# Patient Record
Sex: Female | Born: 1945 | Race: Black or African American | Hispanic: No | Marital: Married | Smoking: Former smoker
Health system: Southern US, Community
[De-identification: ages and names within clinical notes are randomized; demographics above are authoritative.]

## PROBLEM LIST (undated history)

## (undated) DIAGNOSIS — J449 Chronic obstructive pulmonary disease, unspecified: Secondary | ICD-10-CM

## (undated) DIAGNOSIS — K219 Gastro-esophageal reflux disease without esophagitis: Secondary | ICD-10-CM

## (undated) DIAGNOSIS — J45909 Unspecified asthma, uncomplicated: Secondary | ICD-10-CM

## (undated) DIAGNOSIS — E7800 Pure hypercholesterolemia, unspecified: Secondary | ICD-10-CM

## (undated) DIAGNOSIS — M1990 Unspecified osteoarthritis, unspecified site: Secondary | ICD-10-CM

## (undated) HISTORY — PX: ABDOMINAL HYSTERECTOMY: SHX81

## (undated) HISTORY — PX: BREAST EXCISIONAL BIOPSY: SUR124

---

## 2005-04-13 ENCOUNTER — Ambulatory Visit: Payer: Self-pay | Admitting: Family Medicine

## 2006-08-02 ENCOUNTER — Ambulatory Visit: Payer: Self-pay | Admitting: Family Medicine

## 2007-11-04 ENCOUNTER — Ambulatory Visit: Payer: Self-pay | Admitting: Family Medicine

## 2008-04-17 ENCOUNTER — Ambulatory Visit: Payer: Self-pay | Admitting: Family Medicine

## 2008-11-16 ENCOUNTER — Ambulatory Visit: Payer: Self-pay | Admitting: Family Medicine

## 2009-12-06 ENCOUNTER — Ambulatory Visit: Payer: Self-pay | Admitting: Family Medicine

## 2011-02-21 ENCOUNTER — Ambulatory Visit: Payer: Self-pay | Admitting: Family Medicine

## 2011-11-20 ENCOUNTER — Ambulatory Visit: Payer: Self-pay

## 2011-11-20 LAB — RAPID INFLUENZA A&B ANTIGENS

## 2012-02-22 ENCOUNTER — Ambulatory Visit: Payer: Self-pay | Admitting: Family Medicine

## 2012-08-29 ENCOUNTER — Ambulatory Visit: Payer: Self-pay | Admitting: Family Medicine

## 2013-04-03 ENCOUNTER — Ambulatory Visit: Payer: Self-pay | Admitting: Family Medicine

## 2014-05-19 ENCOUNTER — Ambulatory Visit: Payer: Self-pay | Admitting: Family Medicine

## 2015-04-06 ENCOUNTER — Other Ambulatory Visit: Payer: Self-pay | Admitting: Family Medicine

## 2015-04-06 DIAGNOSIS — Z780 Asymptomatic menopausal state: Secondary | ICD-10-CM

## 2015-05-09 ENCOUNTER — Ambulatory Visit
Admission: EM | Admit: 2015-05-09 | Discharge: 2015-05-09 | Disposition: A | Discharge status: Home / Self Care | Payer: BLUE CROSS/BLUE SHIELD | Attending: Internal Medicine | Admitting: Internal Medicine

## 2015-05-09 DIAGNOSIS — M5410 Radiculopathy, site unspecified: Secondary | ICD-10-CM

## 2015-05-09 DIAGNOSIS — S39012A Strain of muscle, fascia and tendon of lower back, initial encounter: Secondary | ICD-10-CM | POA: Diagnosis not present

## 2015-05-09 HISTORY — DX: Pure hypercholesterolemia, unspecified: E78.00

## 2015-05-09 HISTORY — DX: Unspecified osteoarthritis, unspecified site: M19.90

## 2015-05-09 MED ORDER — KETOROLAC TROMETHAMINE 60 MG/2ML IM SOLN
60.0000 mg | Freq: Once | INTRAMUSCULAR | Status: AC
  Administered 2015-05-09: 60 mg via INTRAMUSCULAR

## 2015-05-09 MED ORDER — METAXALONE 800 MG PO TABS: 800.0000 mg | ORAL_TABLET | Freq: Three times a day (TID) | ORAL | Status: DC

## 2015-05-09 MED ORDER — NAPROXEN SODIUM 550 MG PO TABS: 550.0000 mg | ORAL_TABLET | Freq: Two times a day (BID) | ORAL | Status: DC

## 2015-05-09 NOTE — ED Notes (Signed)
Lower back pain for the past 1 month. Twinges at times makes it hard to even stand. Has had back spasm.

## 2015-05-09 NOTE — Discharge Instructions (Signed)
Lumbosacral Radiculopathy Lumbosacral radiculopathy is a pinched nerve or nerves in the low back (lumbosacral area). When this happens you may have weakness in your legs and may not be able to stand on your toes. You may have pain going down into your legs. There may be difficulties with walking normally. There are many causes of this problem. Sometimes this may happen from an injury, or simply from arthritis or boney problems. It may also be caused by other illnesses such as diabetes. If there is no improvement after treatment, further studies may be done to find the exact cause. DIAGNOSIS  X-rays may be needed if the problems become long standing. Electromyograms may be done. This study is one in which the working of nerves and muscles is studied. HOME CARE INSTRUCTIONS   Applications of ice packs may be helpful. Ice can be used in a plastic bag with a towel around it to prevent frostbite to skin. This may be used every 2 hours for 20 to 30 minutes, or as needed, while awake, or as directed by your caregiver.  Only take over-the-counter or prescription medicines for pain, discomfort, or fever as directed by your caregiver.  If physical therapy was prescribed, follow your caregiver's directions. SEEK IMMEDIATE MEDICAL CARE IF:   You have pain not controlled with medications.  You seem to be getting worse rather than better.  You develop increasing weakness in your legs.  You develop loss of bowel or bladder control.  You have difficulty with walking or balance, or develop clumsiness in the use of your legs.  You have a fever. MAKE SURE YOU:   Understand these instructions.  Will watch your condition.  Will get help right away if you are not doing well or get worse. Document Released: 10/02/2005 Document Revised: 12/25/2011 Document Reviewed: 05/22/2008 [facility] Patient Information 2015 [place], [place]. This information is not intended to replace advice given to you by your health  care provider. Make sure you discuss any questions you have with your health care provider. Cervical Radiculopathy Cervical radiculopathy happens when a nerve in the neck is pinched or bruised by a slipped (herniated) disk or by arthritic changes in the bones of the cervical spine. This can occur due to an injury or as part of the normal aging process. Pressure on the cervical nerves can cause pain or numbness that runs from your neck all the way down into your arm and fingers. CAUSES  There are many possible causes, including:  Injury.  Muscle tightness in the neck from overuse.  Swollen, painful joints (arthritis).  Breakdown or degeneration in the bones and joints of the spine (spondylosis) due to aging.  Bone spurs that may develop near the cervical nerves. SYMPTOMS  Symptoms include pain, weakness, or numbness in the affected arm and hand. Pain can be severe or irritating. Symptoms may be worse when extending or turning the neck. DIAGNOSIS  Your caregiver will ask about your symptoms and do a physical exam. He or she may test your strength and reflexes. X-rays, CT scans, and MRI scans may be needed in cases of injury or if the symptoms do not go away after a period of time. Electromyography (EMG) or nerve conduction testing may be done to study how your nerves and muscles are working. TREATMENT  Your caregiver may recommend certain exercises to help relieve your symptoms. Cervical radiculopathy can, and often does, get better with time and treatment. If your problems continue, treatment options may include:  Wearing a soft  collar for short periods of time.  Physical therapy to strengthen the neck muscles.  Medicines, such as nonsteroidal anti-inflammatory drugs (NSAIDs), oral corticosteroids, or spinal injections.  Surgery. Different types of surgery may be done depending on the cause of your problems. HOME CARE INSTRUCTIONS   Put ice on the affected area.  Put ice in a plastic  bag.  Place a towel between your skin and the bag.  Leave the ice on for 15-20 minutes, 03-04 times a day or as directed by your caregiver.  If ice does not help, you can try using heat. Take a warm shower or bath, or use a hot water bottle as directed by your caregiver.  You may try a gentle neck and shoulder massage.  Use a flat pillow when you sleep.  Only take over-the-counter or prescription medicines for pain, discomfort, or fever as directed by your caregiver.  If physical therapy was prescribed, follow your caregiver's directions.  If a soft collar was prescribed, use it as directed. SEEK IMMEDIATE MEDICAL CARE IF:   Your pain gets much worse and cannot be controlled with medicines.  You have weakness or numbness in your hand, arm, face, or leg.  You have a high fever or a stiff, rigid neck.  You lose bowel or bladder control (incontinence).  You have trouble with walking, balance, or speaking. MAKE SURE YOU:   Understand these instructions.  Will watch your condition.  Will get help right away if you are not doing well or get worse. Document Released: 06/27/2001 Document Revised: 12/25/2011 Document Reviewed: 05/16/2011 [facility] Patient Information 2015 [place], [place]. This information is not intended to replace advice given to you by your health care provider. Make sure you discuss any questions you have with your health care provider.  Low Back Strain with Rehab A strain is an injury in which a tendon or muscle is torn. The muscles and tendons of the lower back are vulnerable to strains. However, these muscles and tendons are very strong and require a great force to be injured. Strains are classified into three categories. Grade 1 strains cause pain, but the tendon is not lengthened. Grade 2 strains include a lengthened ligament, due to the ligament being stretched or partially ruptured. With grade 2 strains there is still function, although the function may be  decreased. Grade 3 strains involve a complete tear of the tendon or muscle, and function is usually impaired. SYMPTOMS   Pain in the lower back.  Pain that affects one side more than the other.  Pain that gets worse with movement and may be felt in the hip, buttocks, or back of the thigh.  Muscle spasms of the muscles in the back.  Swelling along the muscles of the back.  Loss of strength of the back muscles.  Crackling sound (crepitation) when the muscles are touched. CAUSES  Lower back strains occur when a force is placed on the muscles or tendons that is greater than they can handle. Common causes of injury include:  Prolonged overuse of the muscle-tendon units in the lower back, usually from incorrect posture.  A single violent injury or force applied to the back. RISK INCREASES WITH:  Sports that involve twisting forces on the spine or a lot of bending at the waist (football, rugby, weightlifting, bowling, golf, tennis, speed skating, racquetball, swimming, running, gymnastics, diving).  Poor strength and flexibility.  Failure to warm up properly before activity.  Family history of lower back pain or disk disorders.  Previous back injury or surgery (especially fusion).  Poor posture with lifting, especially heavy objects.  Prolonged sitting, especially with poor posture. PREVENTION   Learn and use proper posture when sitting or lifting (maintain proper posture when sitting, lift using the knees and legs, not at the waist).  Warm up and stretch properly before activity.  Allow for adequate recovery between workouts.  Maintain physical fitness:  Strength, flexibility, and endurance.  Cardiovascular fitness. PROGNOSIS  If treated properly, lower back strains usually heal within 6 weeks. RELATED COMPLICATIONS   Recurring symptoms, resulting in a chronic problem.  Chronic inflammation, scarring, and partial muscle-tendon tear.  Delayed healing or resolution  of symptoms.  Prolonged disability. TREATMENT  Treatment first involves the use of ice and medicine, to reduce pain and inflammation. The use of strengthening and stretching exercises may help reduce pain with activity. These exercises may be performed at home or with a therapist. Severe injuries may require referral to a therapist for further evaluation and treatment, such as ultrasound. Your caregiver may advise that you wear a back brace or corset, to help reduce pain and discomfort. Often, prolonged bed rest results in greater harm then benefit. Corticosteroid injections may be recommended. However, these should be reserved for the most serious cases. It is important to avoid using your back when lifting objects. At night, sleep on your back on a firm mattress with a pillow placed under your knees. If non-surgical treatment is unsuccessful, surgery may be needed.  MEDICATION   If pain medicine is needed, nonsteroidal anti-inflammatory medicines (aspirin and ibuprofen), or other minor pain relievers (acetaminophen), are often advised.  Do not take pain medicine for 7 days before surgery.  Prescription pain relievers may be given, if your caregiver thinks they are needed. Use only as directed and only as much as you need.  Ointments applied to the skin may be helpful.  Corticosteroid injections may be given by your caregiver. These injections should be reserved for the most serious cases, because they may only be given a certain number of times. HEAT AND COLD  Cold treatment (icing) should be applied for 10 to 15 minutes every 2 to 3 hours for inflammation and pain, and immediately after activity that aggravates your symptoms. Use ice packs or an ice massage.  Heat treatment may be used before performing stretching and strengthening activities prescribed by your caregiver, physical therapist, or athletic trainer. Use a heat pack or a warm water soak. SEEK MEDICAL CARE IF:   Symptoms get  worse or do not improve in 2 to 4 weeks, despite treatment.  You develop numbness, weakness, or loss of bowel or bladder function.  New, unexplained symptoms develop. (Drugs used in treatment may produce side effects.) EXERCISES  RANGE OF MOTION (ROM) AND STRETCHING EXERCISES - Low Back Strain Most people with lower back pain will find that their symptoms get worse with excessive bending forward (flexion) or arching at the lower back (extension). The exercises which will help resolve your symptoms will focus on the opposite motion.  Your physician, physical therapist or athletic trainer will help you determine which exercises will be most helpful to resolve your lower back pain. Do not complete any exercises without first consulting with your caregiver. Discontinue any exercises which make your symptoms worse until you speak to your caregiver.  If you have pain, numbness or tingling which travels down into your buttocks, leg or foot, the goal of the therapy is for these symptoms to move closer to  your back and eventually resolve. Sometimes, these leg symptoms will get better, but your lower back pain may worsen. This is typically an indication of progress in your rehabilitation. Be very alert to any changes in your symptoms and the activities in which you participated in the 24 hours prior to the change. Sharing this information with your caregiver will allow him/her to most efficiently treat your condition.  These exercises may help you when beginning to rehabilitate your injury. Your symptoms may resolve with or without further involvement from your physician, physical therapist or athletic trainer. While completing these exercises, remember:  Restoring tissue flexibility helps normal motion to return to the joints. This allows healthier, less painful movement and activity.  An effective stretch should be held for at least 30 seconds.  A stretch should never be painful. You should only feel a  gentle lengthening or release in the stretched tissue. FLEXION RANGE OF MOTION AND STRETCHING EXERCISES: STRETCH - Flexion, Single Knee to Chest   Lie on a firm bed or floor with both legs extended in front of you.  Keeping one leg in contact with the floor, bring your opposite knee to your chest. Hold your leg in place by either grabbing behind your thigh or at your knee.  Pull until you feel a gentle stretch in your lower back. Hold __________ seconds.  Slowly release your grasp and repeat the exercise with the opposite side. Repeat __________ times. Complete this exercise __________ times per day.  STRETCH - Flexion, Double Knee to Chest   Lie on a firm bed or floor with both legs extended in front of you.  Keeping one leg in contact with the floor, bring your opposite knee to your chest.  Tense your stomach muscles to support your back and then lift your other knee to your chest. Hold your legs in place by either grabbing behind your thighs or at your knees.  Pull both knees toward your chest until you feel a gentle stretch in your lower back. Hold __________ seconds.  Tense your stomach muscles and slowly return one leg at a time to the floor. Repeat __________ times. Complete this exercise __________ times per day.  STRETCH - Low Trunk Rotation  Lie on a firm bed or floor. Keeping your legs in front of you, bend your knees so they are both pointed toward the ceiling and your feet are flat on the floor.  Extend your arms out to the side. This will stabilize your upper body by keeping your shoulders in contact with the floor.  Gently and slowly drop both knees together to one side until you feel a gentle stretch in your lower back. Hold for __________ seconds.  Tense your stomach muscles to support your lower back as you bring your knees back to the starting position. Repeat the exercise to the other side. Repeat __________ times. Complete this exercise __________ times per day    EXTENSION RANGE OF MOTION AND FLEXIBILITY EXERCISES: STRETCH - Extension, Prone on Elbows   Lie on your stomach on the floor, a bed will be too soft. Place your palms about shoulder width apart and at the height of your head.  Place your elbows under your shoulders. If this is too painful, stack pillows under your chest.  Allow your body to relax so that your hips drop lower and make contact more completely with the floor.  Hold this position for __________ seconds.  Slowly return to lying flat on the floor. Repeat __________  times. Complete this exercise __________ times per day.  RANGE OF MOTION - Extension, Prone Press Ups  Lie on your stomach on the floor, a bed will be too soft. Place your palms about shoulder width apart and at the height of your head.  Keeping your back as relaxed as possible, slowly straighten your elbows while keeping your hips on the floor. You may adjust the placement of your hands to maximize your comfort. As you gain motion, your hands will come more underneath your shoulders.  Hold this position __________ seconds.  Slowly return to lying flat on the floor. Repeat __________ times. Complete this exercise __________ times per day.  RANGE OF MOTION- Quadruped, Neutral Spine   Assume a hands and knees position on a firm surface. Keep your hands under your shoulders and your knees under your hips. You may place padding under your knees for comfort.  Drop your head and point your tail bone toward the ground below you. This will round out your lower back like an angry cat. Hold this position for __________ seconds.  Slowly lift your head and release your tail bone so that your back sags into a large arch, like an old horse.  Hold this position for __________ seconds.  Repeat this until you feel limber in your lower back.  Now, find your "sweet spot." This will be the most comfortable position somewhere between the two previous positions. This is your  neutral spine. Once you have found this position, tense your stomach muscles to support your lower back.  Hold this position for __________ seconds. Repeat __________ times. Complete this exercise __________ times per day.  STRENGTHENING EXERCISES - Low Back Strain These exercises may help you when beginning to rehabilitate your injury. These exercises should be done near your "sweet spot." This is the neutral, low-back arch, somewhere between fully rounded and fully arched, that is your least painful position. When performed in this safe range of motion, these exercises can be used for people who have either a flexion or extension based injury. These exercises may resolve your symptoms with or without further involvement from your physician, physical therapist or athletic trainer. While completing these exercises, remember:   Muscles can gain both the endurance and the strength needed for everyday activities through controlled exercises.  Complete these exercises as instructed by your physician, physical therapist or athletic trainer. Increase the resistance and repetitions only as guided.  You may experience muscle soreness or fatigue, but the pain or discomfort you are trying to eliminate should never worsen during these exercises. If this pain does worsen, stop and make certain you are following the directions exactly. If the pain is still present after adjustments, discontinue the exercise until you can discuss the trouble with your caregiver. STRENGTHENING - Deep Abdominals, Pelvic Tilt  Lie on a firm bed or floor. Keeping your legs in front of you, bend your knees so they are both pointed toward the ceiling and your feet are flat on the floor.  Tense your lower abdominal muscles to press your lower back into the floor. This motion will rotate your pelvis so that your tail bone is scooping upwards rather than pointing at your feet or into the floor.  With a gentle tension and even breathing,  hold this position for __________ seconds. Repeat __________ times. Complete this exercise __________ times per day.  STRENGTHENING - Abdominals, Crunches   Lie on a firm bed or floor. Keeping your legs in front of you, bend  your knees so they are both pointed toward the ceiling and your feet are flat on the floor. Cross your arms over your chest.  Slightly tip your chin down without bending your neck.  Tense your abdominals and slowly lift your trunk high enough to just clear your shoulder blades. Lifting higher can put excessive stress on the lower back and does not further strengthen your abdominal muscles.  Control your return to the starting position. Repeat __________ times. Complete this exercise __________ times per day.  STRENGTHENING - Quadruped, Opposite UE/LE Lift   Assume a hands and knees position on a firm surface. Keep your hands under your shoulders and your knees under your hips. You may place padding under your knees for comfort.  Find your neutral spine and gently tense your abdominal muscles so that you can maintain this position. Your shoulders and hips should form a rectangle that is parallel with the floor and is not twisted.  Keeping your trunk steady, lift your right hand no higher than your shoulder and then your left leg no higher than your hip. Make sure you are not holding your breath. Hold this position __________ seconds.  Continuing to keep your abdominal muscles tense and your back steady, slowly return to your starting position. Repeat with the opposite arm and leg. Repeat __________ times. Complete this exercise __________ times per day.  STRENGTHENING - Lower Abdominals, Double Knee Lift  Lie on a firm bed or floor. Keeping your legs in front of you, bend your knees so they are both pointed toward the ceiling and your feet are flat on the floor.  Tense your abdominal muscles to brace your lower back and slowly lift both of your knees until they come over  your hips. Be certain not to hold your breath.  Hold __________ seconds. Using your abdominal muscles, return to the starting position in a slow and controlled manner. Repeat __________ times. Complete this exercise __________ times per day.  POSTURE AND BODY MECHANICS CONSIDERATIONS - Low Back Strain Keeping correct posture when sitting, standing or completing your activities will reduce the stress put on different body tissues, allowing injured tissues a chance to heal and limiting painful experiences. The following are general guidelines for improved posture. Your physician or physical therapist will provide you with any instructions specific to your needs. While reading these guidelines, remember:  The exercises prescribed by your provider will help you have the flexibility and strength to maintain correct postures.  The correct posture provides the best environment for your joints to work. All of your joints have less wear and tear when properly supported by a spine with good posture. This means you will experience a healthier, less painful body.  Correct posture must be practiced with all of your activities, especially prolonged sitting and standing. Correct posture is as important when doing repetitive low-stress activities (typing) as it is when doing a single heavy-load activity (lifting). RESTING POSITIONS Consider which positions are most painful for you when choosing a resting position. If you have pain with flexion-based activities (sitting, bending, stooping, squatting), choose a position that allows you to rest in a less flexed posture. You would want to avoid curling into a fetal position on your side. If your pain worsens with extension-based activities (prolonged standing, working overhead), avoid resting in an extended position such as sleeping on your stomach. Most people will find more comfort when they rest with their spine in a more neutral position, neither too rounded nor too  arched. Lying on a non-sagging bed on your side with a pillow between your knees, or on your back with a pillow under your knees will often provide some relief. Keep in mind, being in any one position for a prolonged period of time, no matter how correct your posture, can still lead to stiffness. PROPER SITTING POSTURE In order to minimize stress and discomfort on your spine, you must sit with correct posture. Sitting with good posture should be effortless for a healthy body. Returning to good posture is a gradual process. Many people can work toward this most comfortably by using various supports until they have the flexibility and strength to maintain this posture on their own. When sitting with proper posture, your ears will fall over your shoulders and your shoulders will fall over your hips. You should use the back of the chair to support your upper back. Your lower back will be in a neutral position, just slightly arched. You may place a small pillow or folded towel at the base of your lower back for support.  When working at a desk, create an environment that supports good, upright posture. Without extra support, muscles tire, which leads to excessive strain on joints and other tissues. Keep these recommendations in mind: CHAIR:  A chair should be able to slide under your desk when your back makes contact with the back of the chair. This allows you to work closely.  The chair's height should allow your eyes to be level with the upper part of your monitor and your hands to be slightly lower than your elbows. BODY POSITION  Your feet should make contact with the floor. If this is not possible, use a foot rest.  Keep your ears over your shoulders. This will reduce stress on your neck and lower back. INCORRECT SITTING POSTURES  If you are feeling tired and unable to assume a healthy sitting posture, do not slouch or slump. This puts excessive strain on your back tissues, causing more damage and  pain. Healthier options include:  Using more support, like a lumbar pillow.  Switching tasks to something that requires you to be upright or walking.  Talking a brief walk.  Lying down to rest in a neutral-spine position. PROLONGED STANDING WHILE SLIGHTLY LEANING FORWARD  When completing a task that requires you to lean forward while standing in one place for a long time, place either foot up on a stationary 2-4 inch high object to help maintain the best posture. When both feet are on the ground, the lower back tends to lose its slight inward curve. If this curve flattens (or becomes too large), then the back and your other joints will experience too much stress, tire more quickly, and can cause pain. CORRECT STANDING POSTURES Proper standing posture should be assumed with all daily activities, even if they only take a few moments, like when brushing your teeth. As in sitting, your ears should fall over your shoulders and your shoulders should fall over your hips. You should keep a slight tension in your abdominal muscles to brace your spine. Your tailbone should point down to the ground, not behind your body, resulting in an over-extended swayback posture.  INCORRECT STANDING POSTURES  Common incorrect standing postures include a forward head, locked knees and/or an excessive swayback. WALKING Walk with an upright posture. Your ears, shoulders and hips should all line-up. PROLONGED ACTIVITY IN A FLEXED POSITION When completing a task that requires you to bend forward at your waist or  lean over a low surface, try to find a way to stabilize 3 out of 4 of your limbs. You can place a hand or elbow on your thigh or rest a knee on the surface you are reaching across. This will provide you more stability so that your muscles do not fatigue as quickly. By keeping your knees relaxed, or slightly bent, you will also reduce stress across your lower back. CORRECT LIFTING TECHNIQUES DO :   Assume a wide  stance. This will provide you more stability and the opportunity to get as close as possible to the object which you are lifting.  Tense your abdominals to brace your spine. Bend at the knees and hips. Keeping your back locked in a neutral-spine position, lift using your leg muscles. Lift with your legs, keeping your back straight.  Test the weight of unknown objects before attempting to lift them.  Try to keep your elbows locked down at your sides in order get the best strength from your shoulders when carrying an object.  Always ask for help when lifting heavy or awkward objects. INCORRECT LIFTING TECHNIQUES DO NOT:   Lock your knees when lifting, even if it is a small object.  Bend and twist. Pivot at your feet or move your feet when needing to change directions.  Assume that you can safely pick up even a paper clip without proper posture. Document Released: 10/02/2005 Document Revised: 12/25/2011 Document Reviewed: 01/14/2009 [facility] Patient Information 2015 [place], [place]. This information is not intended to replace advice given to you by your health care provider. Make sure you discuss any questions you have with your health care provider.

## 2015-05-10 NOTE — ED Provider Notes (Signed)
CSN: [id number]     Arrival date & time 05/09/15  0919 History   First MD Initiated Contact with Patient 05/09/15 [id number]     Chief Complaint  Patient presents with  . Back Pain   (Consider location/radiation/quality/duration/timing/severity/associated sxs/prior Treatment) HPI Comments: Married african [place] female here for evaluation of new low back pain midline above waistline that is radiating to her right leg to knee and occasionally midback to left arm 8/10.  Started when she got out of shower to dry off legs today spasms with sitting/changing positions.  Slept ok last night.  Has been having intermittent back pain for the past month waxing and waning.  Has not taken a muscle relaxant for a couple years.  Last bad episode a couple years ago--typically stiff with sitting and if flaring spasms with changing positions  Took 2 OTC aleve yesterday without any relief.  Saw PCM last month and was given exercises to perform but she wasn't able to complete them due to pain especially knee to chest.  The only one able to complete was chest stretching.  Right hand dominant  Denied loss of bowel/bladder control, saddle paresthesias, arm/leg weakness, falls/droppping items.  The history is provided by the patient.    Past Medical History  Diagnosis Date  . High cholesterol   . Arthritis    Past Surgical History  Procedure Laterality Date  . Abdominal hysterectomy     History reviewed. No pertinent family history. History  Substance Use Topics  . Smoking status: Former Games developer  . Smokeless tobacco: Never Used  . Alcohol Use: 0.6 oz/week    1 Glasses of wine per week   OB History    No data available     Review of Systems  Constitutional: Positive for activity change. Negative for fever, chills, diaphoresis, appetite change and fatigue.  HENT: Negative for congestion, dental problem, ear pain, facial swelling, hearing loss, mouth sores, trouble swallowing and voice change.   Eyes: Negative  for photophobia, pain, discharge, redness, itching and visual disturbance.  Respiratory: Negative for cough, shortness of breath and wheezing.   Cardiovascular: Negative for chest pain and leg swelling.  Gastrointestinal: Negative for nausea, vomiting, abdominal pain, diarrhea, constipation, blood in stool and abdominal distention.  Endocrine: Negative for cold intolerance and heat intolerance.  Genitourinary: Negative for dysuria and enuresis.  Musculoskeletal: Positive for myalgias and back pain. Negative for joint swelling, arthralgias, gait problem, neck pain and neck stiffness.  Skin: Negative for color change, pallor, rash and wound.  Allergic/Immunologic: Positive for environmental allergies. Negative for food allergies.  Neurological: Negative for dizziness, tremors, seizures, syncope, facial asymmetry, speech difficulty, weakness, light-headedness, numbness and headaches.  Hematological: Negative for adenopathy. Does not bruise/bleed easily.  Psychiatric/Behavioral: Negative for behavioral problems, confusion, sleep disturbance and agitation.    Allergies  Codeine  Home Medications   Prior to Admission medications   Medication Sig Start Date End Date Taking? Authorizing Provider  albuterol (PROVENTIL HFA;VENTOLIN HFA) 108 (90 BASE) MCG/ACT inhaler Inhale into the lungs every 6 (six) hours as needed for wheezing or shortness of breath.   Yes Historical Provider, MD  fluticasone-salmeterol (ADVAIR HFA) 45-21 MCG/ACT inhaler Inhale 2 puffs into the lungs 2 (two) times daily.   Yes Historical Provider, MD  lovastatin (MEVACOR) 20 MG tablet Take 20 mg by mouth at bedtime.   Yes Historical Provider, MD  traZODone (DESYREL) 150 MG tablet Take by mouth at bedtime.   Yes Historical Provider, MD  metaxalone (SKELAXIN) 800 MG  tablet Take 1 tablet (800 mg total) by mouth 3 (three) times daily. 05/09/15   Barbaraann Barthel, NP  naproxen sodium (ANAPROX) 550 MG tablet Take 1 tablet (550 mg total)  by mouth 2 (two) times daily with a meal. 05/09/15   Jarold Song Betancourt, NP   BP 135/79 mmHg  Pulse 75  Temp(Src) 98.3 F (36.8 C) (Tympanic)  Resp 20  Ht  (1.651 m)  Wt 160 lb (72.576 kg)  BMI 26.63 kg/m2  SpO2 98% Physical Exam  Constitutional: She is oriented to person, place, and time. Vital signs are normal. She appears well-developed and well-nourished. No distress.  HENT:  Head: Normocephalic and atraumatic.  Right Ear: External ear normal.  Left Ear: External ear normal.  Nose: Nose normal.  Mouth/Throat: Oropharynx is clear and moist. No oropharyngeal exudate.  Eyes: Conjunctivae, EOM and lids are normal. Pupils are equal, round, and reactive to light. Right eye exhibits no discharge. Left eye exhibits no discharge. No scleral icterus.  Neck: Trachea normal and normal range of motion. Neck supple. No tracheal deviation present. No thyromegaly present.  Cardiovascular: Normal rate, regular rhythm, normal heart sounds and intact distal pulses.  Exam reveals no gallop and no friction rub.   No murmur heard. Pulmonary/Chest: Effort normal and breath sounds normal. No stridor. No respiratory distress. She has no wheezes. She has no rales. She exhibits no tenderness.  Abdominal: Soft. Bowel sounds are normal. She exhibits no distension and no mass. There is no tenderness. There is no rebound and no guarding.  Musculoskeletal: She exhibits tenderness. She exhibits no edema.       Right shoulder: Normal.       Left shoulder: Normal.       Right elbow: Normal.      Left elbow: Normal.       Cervical back: Normal.       Thoracic back: She exhibits tenderness, pain and spasm. She exhibits normal range of motion, no bony tenderness, no swelling, no edema, no deformity and no laceration.       Lumbar back: She exhibits decreased range of motion, tenderness, pain and spasm. She exhibits no bony tenderness, no swelling, no edema, no deformity, no laceration and normal pulse.       Right  hand: Normal.       Left hand: Normal.       Right upper leg: Normal. She exhibits no tenderness, no bony tenderness, no swelling, no edema, no deformity and no laceration.       Left upper leg: Normal. She exhibits no tenderness, no bony tenderness, no swelling, no edema, no deformity and no laceration.  Full arom c-spine; AROM with pain t-/l-spine able to only touch top of thighs for forward flexion approx 30 degrees patient reported sharp shooting pain right leg posterior to knee resolved with return to neutral position; normal heel toe slow in exam room; crossbody reach right worsens low back lumbar pain; right rotation worsens lumbar pain; left lateral bending worsens lumbar pain; negative bilateral straight leg raise; tight hamstrings; bilateral trapezius muscles tense; paraspinals T10-L1 taut/spams and TTP; strength 5/5 bilateral upper and lower extremities; DTRs 1+ symmetric bilaterally with distraction  Lymphadenopathy:    She has no cervical adenopathy.  Neurological: She is alert and oriented to person, place, and time. She is not disoriented. She displays no atrophy and no tremor. No cranial nerve deficit or sensory deficit. She exhibits normal muscle tone. She displays no seizure activity. Coordination and  gait normal. GCS eye subscore is 4. GCS verbal subscore is 5. GCS motor subscore is 6.  Reflex Scores:      Tricep reflexes are 1+ on the right side and 1+ on the left side.      Bicep reflexes are 1+ on the right side and 1+ on the left side.      Brachioradialis reflexes are 1+ on the right side and 1+ on the left side.      Patellar reflexes are 1+ on the right side and 1+ on the left side.      Achilles reflexes are 1+ on the right side and 1+ on the left side. Skin: Skin is warm, dry and intact. No rash noted. She is not diaphoretic. No erythema. No pallor.  Psychiatric: She has a normal mood and affect. Her speech is normal and behavior is normal. Judgment and thought content  normal. Cognition and memory are normal.  Nursing note and vitals reviewed.   ED Course  Procedures (including critical care time) Labs Review Labs Reviewed - No data to display  Imaging Review No results found. Medications  ketorolac (TORADOL) injection 60 mg (60 mg Intramuscular Given 05/09/15 1004)   Filed Vitals:   05/09/15 1052  BP: 135/79  Pulse: 75  Temp:   Resp: 20  1030 patient reported pain decreased to 4/10 after toradol injection feeling much better, radiation to leg resolved and ready to go home.  Discussed with patient I had ordered repeat vital signs prior to discharge and nurse would be in shortly to complete and give her discharge paperwork.  Patient ambulatory in exam room, moving from exam table to chair without difficulty or hesitancy now normal gait, face relaxed. Smiling.  Discussed may take skelaxin this afternoon if needed for worsening pain hold naproxen until bedtime tonight.  Ensure she hydrates all day.  Follow up if saddle paresthesias, leg/arm weakness or loss of bowel/bladder control with ER tonight.  Typically back pain resolves within a couple of weeks.  Avoid prolonged stationary/static positions during awake hours.  Gentle AROM/ADLSs. Spouse driving patient home.  Patient verbalized understanding of information/instructions, agreed with plan of care and had no further questions at this time.  MDM   1. Low back strain, initial encounter   2. Radiculopathy, unspecified spinal region    For acute pain, rest, and intermittent application of heat (do not sleep on heating pad) or ice.  I discussed longer-term treatment plan of PRN PO NSAIDSRx for naproxen 550mg  po BID and skelaxin 800mg  po TID and I discussed a home back care exercise program with a strengthening and flexibility exercise.  Patient given [employer] handout on low back strain with rehab exercises and sciatica/radiculopathy.  Proper avoidance of heavy lifting discussed.  Consider physical therapy or  chiropractic care and radiology if not improving.Avoid driving or drinking alcohol while taking muscle relaxants--cautioned may cause dizzyness and drowsiness.  Call or return to clinic as needed if these symptoms worsen or fail to improve as anticipated especially leg weakness, loss of bowel/bladder control or saddle paresthesias.   Patient verbalized understanding of instructions/information and agreed with plan of care.  P2:  Injury Prevention, fitness    Barbaraann Barthel, NP 05/10/15 2209

## 2015-06-15 ENCOUNTER — Other Ambulatory Visit: Payer: Self-pay | Admitting: Family Medicine

## 2015-06-15 DIAGNOSIS — Z1231 Encounter for screening mammogram for malignant neoplasm of breast: Secondary | ICD-10-CM

## 2015-06-22 ENCOUNTER — Ambulatory Visit
Admission: RE | Admit: 2015-06-22 | Discharge: 2015-06-22 | Disposition: A | Discharge status: Home / Self Care | Payer: BLUE CROSS/BLUE SHIELD | Source: Ambulatory Visit | Attending: Family Medicine | Admitting: Family Medicine

## 2015-06-22 DIAGNOSIS — M8580 Other specified disorders of bone density and structure, unspecified site: Secondary | ICD-10-CM | POA: Insufficient documentation

## 2015-06-22 DIAGNOSIS — Z1231 Encounter for screening mammogram for malignant neoplasm of breast: Secondary | ICD-10-CM | POA: Diagnosis not present

## 2015-06-22 DIAGNOSIS — Z780 Asymptomatic menopausal state: Secondary | ICD-10-CM | POA: Diagnosis present

## 2015-06-22 DIAGNOSIS — Z13820 Encounter for screening for osteoporosis: Secondary | ICD-10-CM | POA: Insufficient documentation

## 2016-07-21 ENCOUNTER — Other Ambulatory Visit: Payer: Self-pay | Admitting: Family Medicine

## 2016-07-21 DIAGNOSIS — Z1231 Encounter for screening mammogram for malignant neoplasm of breast: Secondary | ICD-10-CM

## 2016-07-26 ENCOUNTER — Ambulatory Visit: Admission: RE | Admit: 2016-07-26 | Payer: BLUE CROSS/BLUE SHIELD | Source: Ambulatory Visit

## 2016-08-08 ENCOUNTER — Other Ambulatory Visit: Payer: Self-pay | Admitting: Family Medicine

## 2016-08-08 ENCOUNTER — Ambulatory Visit
Admission: RE | Admit: 2016-08-08 | Discharge: 2016-08-08 | Disposition: A | Discharge status: Home / Self Care | Payer: Managed Care, Other (non HMO) | Source: Ambulatory Visit | Attending: Family Medicine | Admitting: Family Medicine

## 2016-08-08 DIAGNOSIS — Z1231 Encounter for screening mammogram for malignant neoplasm of breast: Secondary | ICD-10-CM | POA: Diagnosis not present

## 2016-10-16 HISTORY — PX: EYE SURGERY: SHX253

## 2016-11-14 ENCOUNTER — Encounter: Payer: Self-pay | Admitting: *Deleted

## 2016-11-14 ENCOUNTER — Ambulatory Visit
Admission: EM | Admit: 2016-11-14 | Discharge: 2016-11-14 | Disposition: A | Discharge status: Home / Self Care | Payer: Managed Care, Other (non HMO) | Attending: Family Medicine | Admitting: Family Medicine

## 2016-11-14 DIAGNOSIS — J0100 Acute maxillary sinusitis, unspecified: Secondary | ICD-10-CM | POA: Diagnosis not present

## 2016-11-14 DIAGNOSIS — J441 Chronic obstructive pulmonary disease with (acute) exacerbation: Secondary | ICD-10-CM | POA: Diagnosis not present

## 2016-11-14 MED ORDER — PREDNISONE 20 MG PO TABS: ORAL_TABLET | ORAL | 0 refills | Status: DC

## 2016-11-14 MED ORDER — BENZONATATE 100 MG PO CAPS: 100.0000 mg | ORAL_CAPSULE | Freq: Three times a day (TID) | ORAL | 0 refills | Status: DC | PRN

## 2016-11-14 MED ORDER — AMOXICILLIN-POT CLAVULANATE 875-125 MG PO TABS: 1.0000 | ORAL_TABLET | Freq: Two times a day (BID) | ORAL | 0 refills | Status: DC

## 2016-11-14 NOTE — ED Triage Notes (Signed)
Productive cough- clear, dyspnea, headache, x several days. Pt recently tx for pneumonia.

## 2016-11-14 NOTE — ED Provider Notes (Signed)
[facility]    CSN: [id number] Arrival date & time: 11/14/16  1014     History   Chief Complaint Chief Complaint  Patient presents with  . Cough  . Shortness of Breath  . Headache    HPI Rachael Mills is a 71 y.o. female.   The history is provided by the patient.  Cough  Associated symptoms: headaches, shortness of breath and wheezing   Shortness of Breath  Associated symptoms: cough, headaches and wheezing   Headache  Associated symptoms: congestion, cough, facial pain, fatigue and URI   URI  Presenting symptoms: congestion, cough, facial pain and fatigue   Onset quality:  Sudden Duration:  1 week Timing:  Constant Progression:  Worsening Chronicity:  New Relieved by:  Nothing Ineffective treatments:  OTC medications Associated symptoms: headaches, sinus pain and wheezing   Risk factors: being elderly, chronic respiratory disease (copd), recent illness and sick contacts   Risk factors: no chronic cardiac disease, no chronic kidney disease, no diabetes mellitus, no immunosuppression and no recent travel     Past Medical History:  Diagnosis Date  . Arthritis   . High cholesterol     There are no active problems to display for this patient.   Past Surgical History:  Procedure Laterality Date  . ABDOMINAL HYSTERECTOMY      OB History    No data available       Home Medications    Prior to Admission medications   Medication Sig Start Date End Date Taking? Authorizing Provider  albuterol (PROVENTIL HFA;VENTOLIN HFA) 108 (90 BASE) MCG/ACT inhaler Inhale into the lungs every 6 (six) hours as needed for wheezing or shortness of breath.   Yes Historical Provider, MD  fluticasone-salmeterol (ADVAIR HFA) 45-21 MCG/ACT inhaler Inhale 2 puffs into the lungs 2 (two) times daily.   Yes Historical Provider, MD  lovastatin (MEVACOR) 20 MG tablet Take 20 mg by mouth at bedtime.   Yes Historical Provider, MD  traZODone (DESYREL) 150 MG tablet  Take by mouth at bedtime.   Yes Historical Provider, MD  amoxicillin-clavulanate (AUGMENTIN) 875-125 MG tablet Take 1 tablet by mouth 2 (two) times daily. 11/14/16   Payton Mccallum, MD  benzonatate (TESSALON) 100 MG capsule Take 1 capsule (100 mg total) by mouth 3 (three) times daily as needed for cough. 11/14/16   Payton Mccallum, MD  metaxalone (SKELAXIN) 800 MG tablet Take 1 tablet (800 mg total) by mouth 3 (three) times daily. 05/09/15   Barbaraann Barthel, NP  naproxen sodium (ANAPROX) 550 MG tablet Take 1 tablet (550 mg total) by mouth 2 (two) times daily with a meal. 05/09/15   Barbaraann Barthel, NP  predniSONE (DELTASONE) 20 MG tablet 3 tabs po qd for 2 days, then 2 tabs po qd for 3 days, then 1 tab po qd for 3 days, then half a tab po qd for 2 days 11/14/16   Payton Mccallum, MD    Family History Family History  Problem Relation Age of Onset  . Breast cancer Neg Hx     Social History Social History  Substance Use Topics  . Smoking status: Former Games developer  . Smokeless tobacco: Never Used  . Alcohol use 0.6 oz/week    1 Glasses of wine per week     Allergies   Codeine   Review of Systems Review of Systems  Constitutional: Positive for fatigue.  HENT: Positive for congestion and sinus pain.   Respiratory: Positive for cough, shortness of breath  and wheezing.   Neurological: Positive for headaches.     Physical Exam Triage Vital Signs ED Triage Vitals  Enc Vitals Group     BP 11/14/16 1026 135/74     Pulse Rate 11/14/16 1026 (!) 105     Resp 11/14/16 1026 16     Temp 11/14/16 1026 99.3 F (37.4 C)     Temp Source 11/14/16 1026 Oral     SpO2 11/14/16 1026 97 %     Weight 11/14/16 1028 160 lb (72.6 kg)     Height 11/14/16 1028 5\' 5"  (1.651 m)     Head Circumference --      Peak Flow --      Pain Score --      Pain Loc --      Pain Edu? --      Excl. in GC? --    No data found.   Updated Vital Signs BP 135/74 (BP Location: Left Arm)   Pulse (!) 105   Temp 99.3 F  (37.4 C) (Oral)   Resp 16   Ht 5\' 5"  (1.651 m)   Wt 160 lb (72.6 kg)   SpO2 97%   BMI 26.63 kg/m   Visual Acuity Right Eye Distance:   Left Eye Distance:   Bilateral Distance:    Right Eye Near:   Left Eye Near:    Bilateral Near:     Physical Exam  Constitutional: She appears well-developed and well-nourished. No distress.  HENT:  Head: Normocephalic and atraumatic.  Right Ear: Tympanic membrane, external ear and ear canal normal.  Left Ear: Tympanic membrane, external ear and ear canal normal.  Nose: Mucosal edema and rhinorrhea present. No nose lacerations, sinus tenderness, nasal deformity, septal deviation or nasal septal hematoma. No epistaxis.  No foreign bodies. Right sinus exhibits maxillary sinus tenderness and frontal sinus tenderness. Left sinus exhibits maxillary sinus tenderness and frontal sinus tenderness.  Mouth/Throat: Uvula is midline, oropharynx is clear and moist and mucous membranes are normal. No oropharyngeal exudate.  Eyes: Conjunctivae and EOM are normal. Pupils are equal, round, and reactive to light. Right eye exhibits no discharge. Left eye exhibits no discharge. No scleral icterus.  Neck: Normal range of motion. Neck supple. No thyromegaly present.  Cardiovascular: Normal rate, regular rhythm and normal heart sounds.   Pulmonary/Chest: Effort normal. No respiratory distress. She has wheezes (diffuse; rhonchi). She has no rales.  Lymphadenopathy:    She has no cervical adenopathy.  Skin: She is not diaphoretic.  Nursing note and vitals reviewed.    UC Treatments / Results  Labs (all labs ordered are listed, but only abnormal results are displayed) Labs Reviewed - No data to display  EKG  EKG Interpretation None       Radiology No results found.  Procedures Procedures (including critical care time)  Medications Ordered in UC Medications - No data to display   Initial Impression / Assessment and Plan / UC Course  I have reviewed  the triage vital signs and the nursing notes.  Pertinent labs & imaging results that were available during my care of the patient were reviewed by me and considered in my medical decision making (see chart for details).       Final Clinical Impressions(s) / UC Diagnoses   Final diagnoses:  COPD exacerbation (HCC)  Acute maxillary sinusitis, recurrence not specified    New Prescriptions Discharge Medication List as of 11/14/2016 11:01 AM    START taking these medications   Details  amoxicillin-clavulanate (AUGMENTIN) 875-125 MG tablet Take 1 tablet by mouth 2 (two) times daily., Starting Tue 11/14/2016, Normal    benzonatate (TESSALON) 100 MG capsule Take 1 capsule (100 mg total) by mouth 3 (three) times daily as needed for cough., Starting Tue 11/14/2016, Normal    predniSONE (DELTASONE) 20 MG tablet 3 tabs po qd for 2 days, then 2 tabs po qd for 3 days, then 1 tab po qd for 3 days, then half a tab po qd for 2 days, Normal       1. diagnosis reviewed with patient 2. rx as per orders above; reviewed possible side effects, interactions, risks and benefits  3. Recommend supportive treatment with rest, fluids 4. Follow-up prn if symptoms worsen or don't improve   Payton Mccallumrlando Benen Weida, MD 11/14/16 1113

## 2016-12-24 ENCOUNTER — Ambulatory Visit
Admission: EM | Admit: 2016-12-24 | Discharge: 2016-12-24 | Disposition: A | Discharge status: Home / Self Care | Payer: Managed Care, Other (non HMO) | Attending: Family Medicine | Admitting: Family Medicine

## 2016-12-24 ENCOUNTER — Encounter: Payer: Self-pay | Admitting: Emergency Medicine

## 2016-12-24 DIAGNOSIS — J069 Acute upper respiratory infection, unspecified: Secondary | ICD-10-CM | POA: Diagnosis not present

## 2016-12-24 DIAGNOSIS — J029 Acute pharyngitis, unspecified: Secondary | ICD-10-CM

## 2016-12-24 DIAGNOSIS — J441 Chronic obstructive pulmonary disease with (acute) exacerbation: Secondary | ICD-10-CM

## 2016-12-24 LAB — RAPID STREP SCREEN (MED CTR MEBANE ONLY): Streptococcus, Group A Screen (Direct): NEGATIVE

## 2016-12-24 MED ORDER — PREDNISONE 20 MG PO TABS: 40.0000 mg | ORAL_TABLET | Freq: Every day | ORAL | 0 refills | Status: DC

## 2016-12-24 MED ORDER — DOXYCYCLINE HYCLATE 100 MG PO CAPS: 100.0000 mg | ORAL_CAPSULE | Freq: Two times a day (BID) | ORAL | 0 refills | Status: DC

## 2016-12-24 MED ORDER — BENZONATATE 100 MG PO CAPS: 100.0000 mg | ORAL_CAPSULE | Freq: Three times a day (TID) | ORAL | 0 refills | Status: DC | PRN

## 2016-12-24 NOTE — ED Provider Notes (Signed)
[facility] ____________________________________________  Time seen: Approximately 9:10 AM  I have reviewed the triage vital signs and the nursing notes.   HISTORY  Chief Complaint Cough  HPI Rachael Mills is a 71 y.o. female with past medical history of hypertension, hyperlipidemia, COPD presenting for 5 days of cough, congestion and sore throat. Reports some hoarse voice. States cough is a dry hacking nonproductive cough, occasional wheeze. Uses home albuterol inhalers as needed with some improvement. States clear nasal drainage, denies sinus pain. Reports did have some ear discomfort, denies currently. States mild sore throat currently. Reports has continued to eat and drink well. Reports has continued to remain active. Denies known sick contacts. Denies home sick contacts. Denies fevers. Denies chest pain or shortness of breath.  Patient reports that she has been on 2 courses of Augmentin recently treated reports getting in January she was treated with Augmentin for pneumonia, then second round in January for sinusitis. Patient reports she had been doing better after the sinusitis and pneumonia. Has been following with her primary physician.  Denies chest pain, pain with deep breath, shortness of breath, abdominal pain, dysuria, extremity pain, extremity swelling or rash. Denies cardiac history. Denies renal insufficiency.  Leim FabryALDRIDGE,BARBARA, MD: PCP  Past Medical History:  Diagnosis Date  . Arthritis   . High cholesterol     There are no active problems to display for this patient.   Past Surgical History:  Procedure Laterality Date  . ABDOMINAL HYSTERECTOMY       No current facility-administered medications for this encounter.   Current Outpatient Prescriptions:  .  albuterol (PROVENTIL HFA;VENTOLIN HFA) 108 (90 BASE) MCG/ACT inhaler, Inhale into the lungs every 6 (six) hours as needed for wheezing or shortness of breath., Disp: , Rfl:  .   benzonatate (TESSALON PERLES) 100 MG capsule, Take 1 capsule (100 mg total) by mouth 3 (three) times daily as needed for cough., Disp: 15 capsule, Rfl: 0 .  doxycycline (VIBRAMYCIN) 100 MG capsule, Take 1 capsule (100 mg total) by mouth 2 (two) times daily., Disp: 20 capsule, Rfl: 0 .  fluticasone-salmeterol (ADVAIR HFA) 45-21 MCG/ACT inhaler, Inhale 2 puffs into the lungs 2 (two) times daily., Disp: , Rfl:  .  lovastatin (MEVACOR) 20 MG tablet, Take 20 mg by mouth at bedtime., Disp: , Rfl:  .  metaxalone (SKELAXIN) 800 MG tablet, Take 1 tablet (800 mg total) by mouth 3 (three) times daily., Disp: 21 tablet, Rfl: 0 .  naproxen sodium (ANAPROX) 550 MG tablet, Take 1 tablet (550 mg total) by mouth 2 (two) times daily with a meal., Disp: 30 tablet, Rfl: 0 .  predniSONE (DELTASONE) 20 MG tablet, Take 2 tablets (40 mg total) by mouth daily., Disp: 10 tablet, Rfl: 0 .  traZODone (DESYREL) 150 MG tablet, Take by mouth at bedtime., Disp: , Rfl:   Allergies Codeine  Family History  Problem Relation Age of Onset  . Breast cancer Neg Hx     Social History Social History  Substance Use Topics  . Smoking status: Former Games developermoker  . Smokeless tobacco: Never Used  . Alcohol use 0.6 oz/week    1 Glasses of wine per week    Review of Systems Constitutional: No fever/chills Eyes: No visual changes. ENT: Positive sore throat. Cardiovascular: Denies chest pain. Respiratory: Denies shortness of breath. Gastrointestinal: No abdominal pain.  No nausea, no vomiting.  No diarrhea.  No constipation. Genitourinary: Negative for dysuria. Musculoskeletal: Negative for back pain. Skin: Negative for rash. Neurological: Negative  for headaches, focal weakness or numbness.  10-point ROS otherwise negative.  ____________________________________________   PHYSICAL EXAM:  VITAL SIGNS: ED Triage Vitals  Enc Vitals Group     BP 12/24/16 0852 124/73     Pulse Rate 12/24/16 0852 89     Resp 12/24/16 0852 16      Temp 12/24/16 0852 98.5 F (36.9 C)     Temp Source 12/24/16 0852 Oral     SpO2 12/24/16 0852 96 %     Weight 12/24/16 0851 155 lb (70.3 kg)     Height 12/24/16 0851 5\' 5"  (1.651 m)     Head Circumference --      Peak Flow --      Pain Score 12/24/16 0852 0     Pain Loc --      Pain Edu? --      Excl. in GC? --     Constitutional: Alert and oriented. Well appearing and in no acute distress. Eyes: Conjunctivae are normal. PERRL. EOMI. Head: Atraumatic. No sinus tenderness to palpation. No swelling. No erythema.  Ears: no erythema, normal TMs bilaterally.   Nose:Nasal congestion with clear rhinorrhea  Mouth/Throat: Mucous membranes are moist. Mild pharyngeal erythema. No tonsillar swelling or exudate.  Neck: No stridor.  No cervical spine tenderness to palpation. Hematological/Lymphatic/Immunilogical: No cervical lymphadenopathy. Cardiovascular: Normal rate, regular rhythm. Grossly normal heart sounds.  Good peripheral circulation. Respiratory: Normal respiratory effort.  No retractions. Mild scattered wheezes and rhonchi. No focal area of consolidation auscultation. Speaks in complete sentences. Good air movement.  Gastrointestinal: Soft and nontender.  Musculoskeletal: Ambulatory with steady gait. No cervical, thoracic or lumbar tenderness to palpation. Bilateral lower extremities nontender with no edema noted.  Neurologic:  Normal speech and language. No gait instability. Skin:  Skin appears warm, dry and intact. No rash noted. Psychiatric: Mood and affect are normal. Speech and behavior are normal. ___________________________________________   LABS (all labs ordered are listed, but only abnormal results are displayed)  Labs Reviewed  RAPID STREP SCREEN (NOT AT [facility])  CULTURE, GROUP A STREP [facility])   PROCEDURES Procedures   INITIAL IMPRESSION / ASSESSMENT AND PLAN / ED COURSE  Pertinent labs & imaging results that were available during my care of the patient were reviewed  by me and considered in my medical decision making (see chart for details).  Well appearing patient. No acute distress. Strep negative, will culture. Suspect viral upper respiratory infection with COPD exacerbation. As patient mild scattered rhonchi and cough with recent pneumonia, will start patient on oral doxycycline and prednisone and tessalon perles prn. Encouraged rest, fluids and PCP follow up.   Discussed follow up with Primary care physician this week. Discussed follow up and return parameters including no resolution or any worsening concerns. Patient verbalized understanding and agreed to plan.   ____________________________________________   FINAL CLINICAL IMPRESSION(S) / ED DIAGNOSES  Final diagnoses:  Upper respiratory tract infection, unspecified type  COPD exacerbation (HCC)  Pharyngitis, unspecified etiology     Discharge Medication List as of 12/24/2016  9:29 AM    START taking these medications   Details  benzonatate (TESSALON PERLES) 100 MG capsule Take 1 capsule (100 mg total) by mouth 3 (three) times daily as needed for cough., Starting Sun 12/24/2016, Normal    doxycycline (VIBRAMYCIN) 100 MG capsule Take 1 capsule (100 mg total) by mouth 2 (two) times daily., Starting Sun 12/24/2016, Normal    predniSONE (DELTASONE) 20 MG tablet Take 2 tablets (40 mg total) by mouth  daily., Starting Sun 12/24/2016, Normal        Note: This dictation was prepared with Dragon dictation along with smaller phrase technology. Any transcriptional errors that result from this process are unintentional.         Renford Dills, NP 12/24/16 [phone]

## 2016-12-24 NOTE — Discharge Instructions (Signed)
Take medication as prescribed. Rest. Drink plenty of fluids.  ° °Follow up with your primary care physician this week as needed. Return to Urgent care for new or worsening concerns.  ° °

## 2016-12-24 NOTE — ED Triage Notes (Signed)
Patient c/o cough, hoarse voice, and congestion since Wed.  Patient denies fevers.

## 2016-12-27 LAB — CULTURE, GROUP A STREP (THRC)

## 2017-10-30 ENCOUNTER — Other Ambulatory Visit: Payer: Self-pay | Admitting: Family Medicine

## 2017-10-30 DIAGNOSIS — Z780 Asymptomatic menopausal state: Secondary | ICD-10-CM

## 2017-10-30 DIAGNOSIS — Z1239 Encounter for other screening for malignant neoplasm of breast: Secondary | ICD-10-CM

## 2017-11-12 ENCOUNTER — Ambulatory Visit
Admission: EM | Admit: 2017-11-12 | Discharge: 2017-11-12 | Disposition: A | Discharge status: Home / Self Care | Payer: Managed Care, Other (non HMO) | Attending: Family Medicine | Admitting: Family Medicine

## 2017-11-12 ENCOUNTER — Other Ambulatory Visit: Payer: Self-pay

## 2017-11-12 DIAGNOSIS — R062 Wheezing: Secondary | ICD-10-CM | POA: Diagnosis not present

## 2017-11-12 DIAGNOSIS — J45901 Unspecified asthma with (acute) exacerbation: Secondary | ICD-10-CM

## 2017-11-12 DIAGNOSIS — R0602 Shortness of breath: Secondary | ICD-10-CM

## 2017-11-12 MED ORDER — PREDNISONE 20 MG PO TABS: ORAL_TABLET | ORAL | 0 refills | Status: DC

## 2017-11-12 MED ORDER — DOXYCYCLINE HYCLATE 100 MG PO TABS: 100.0000 mg | ORAL_TABLET | Freq: Two times a day (BID) | ORAL | 0 refills | Status: DC

## 2017-11-12 MED ORDER — IPRATROPIUM-ALBUTEROL 0.5-2.5 (3) MG/3ML IN SOLN
3.0000 mL | Freq: Once | RESPIRATORY_TRACT | Status: AC
Start: 2017-11-12 — End: 2017-11-12
  Administered 2017-11-12: 3 mL via RESPIRATORY_TRACT

## 2017-11-12 NOTE — ED Provider Notes (Signed)
[facility]    CSN: [id number] Arrival date & time: 11/12/17  1546     History   Chief Complaint Chief Complaint  Patient presents with  . Cough    HPI Rachael Mills is a 72 y.o. female.   72 yo female with a h/o asthma now with a c/o cough, congestion, wheezing and shortness of breath for 3 days. Denies any fevers, chills, chest pain. States has been using her regular inhalers (albuterol and Advair).    The history is provided by the patient.  Cough    Past Medical History:  Diagnosis Date  . Arthritis   . High cholesterol     There are no active problems to display for this patient.   Past Surgical History:  Procedure Laterality Date  . ABDOMINAL HYSTERECTOMY      OB History    No data available       Home Medications    Prior to Admission medications   Medication Sig Start Date End Date Taking? Authorizing Provider  albuterol (PROVENTIL HFA;VENTOLIN HFA) 108 (90 BASE) MCG/ACT inhaler Inhale into the lungs every 6 (six) hours as needed for wheezing or shortness of breath.   Yes [provider]  fluticasone-salmeterol (ADVAIR HFA) 45-21 MCG/ACT inhaler Inhale 2 puffs into the lungs 2 (two) times daily.   Yes [provider]  lovastatin (MEVACOR) 20 MG tablet Take 20 mg by mouth at bedtime.   Yes [provider]  traZODone (DESYREL) 150 MG tablet Take by mouth at bedtime.   Yes [provider]  benzonatate (TESSALON PERLES) 100 MG capsule Take 1 capsule (100 mg total) by mouth 3 (three) times daily as needed for cough. 12/24/16   Renford Dills, NP  doxycycline (VIBRA-TABS) 100 MG tablet Take 1 tablet (100 mg total) by mouth 2 (two) times daily. 11/12/17   Payton Mccallum, MD  metaxalone (SKELAXIN) 800 MG tablet Take 1 tablet (800 mg total) by mouth 3 (three) times daily. 05/09/15   Betancourt, Jarold Song, NP  naproxen sodium (ANAPROX) 550 MG tablet Take 1 tablet (550 mg total) by mouth 2 (two) times daily  with a meal. 05/09/15   Betancourt, Jarold Song, NP  predniSONE (DELTASONE) 20 MG tablet 3 tabs po qd x 2 days, then 2 tabs po qd x 3 days, then 1 tab po qd x 3 days, then half a tab po qd x 2 days 11/12/17   Payton Mccallum, MD    Family History Family History  Problem Relation Age of Onset  . Deep vein thrombosis Mother   . Cancer Father   . Breast cancer Neg Hx     Social History Social History   Tobacco Use  . Smoking status: Former Games developer  . Smokeless tobacco: Never Used  Substance Use Topics  . Alcohol use: No    Frequency: Never  . Drug use: No     Allergies   Codeine   Review of Systems Review of Systems  Respiratory: Positive for cough.      Physical Exam Triage Vital Signs ED Triage Vitals  Enc Vitals Group     BP 11/12/17 1621 (!) 153/76     Pulse Rate 11/12/17 1621 (!) 108     Resp 11/12/17 1621 20     Temp 11/12/17 1621 98.6 F (37 C)     Temp Source 11/12/17 1621 Oral     SpO2 11/12/17 1621 93 %     Weight 11/12/17 1617 152 lb (68.9  kg)     Height 11/12/17 1617 5' 4.5" (1.638 m)     Head Circumference --      Peak Flow --      Pain Score 11/12/17 1617 0     Pain Loc --      Pain Edu? --      Excl. in GC? --    No data found.  Updated Vital Signs BP (!) 153/76 (BP Location: Left Arm)   Pulse 92   Temp 98.6 F (37 C) (Oral)   Resp 20   Ht 5' 4.5" (1.638 m)   Wt 152 lb (68.9 kg)   SpO2 97%   BMI 25.69 kg/m   Visual Acuity Right Eye Distance:   Left Eye Distance:   Bilateral Distance:    Right Eye Near:   Left Eye Near:    Bilateral Near:     Physical Exam  Constitutional: She appears well-developed and well-nourished. No distress.  HENT:  Head: Normocephalic and atraumatic.  Right Ear: Tympanic membrane, external ear and ear canal normal.  Left Ear: Tympanic membrane, external ear and ear canal normal.  Nose: No mucosal edema, rhinorrhea, nose lacerations, sinus tenderness, nasal deformity, septal deviation or nasal septal  hematoma. No epistaxis.  No foreign bodies. Right sinus exhibits no maxillary sinus tenderness and no frontal sinus tenderness. Left sinus exhibits no maxillary sinus tenderness and no frontal sinus tenderness.  Mouth/Throat: Uvula is midline, oropharynx is clear and moist and mucous membranes are normal. No oropharyngeal exudate.  Eyes: Conjunctivae and EOM are normal. Pupils are equal, round, and reactive to light. Right eye exhibits no discharge. Left eye exhibits no discharge. No scleral icterus.  Neck: Normal range of motion. Neck supple. No thyromegaly present.  Cardiovascular: Normal rate, regular rhythm and normal heart sounds.  Pulmonary/Chest: Effort normal. No stridor. No respiratory distress. She has wheezes (diffuse, bilateral, expiratory wheezes). She has no rales.  Lymphadenopathy:    She has no cervical adenopathy.  Skin: She is not diaphoretic.  Nursing note and vitals reviewed.    UC Treatments / Results  Labs (all labs ordered are listed, but only abnormal results are displayed) Labs Reviewed - No data to display  EKG  EKG Interpretation None       Radiology No results found.  Procedures Procedures (including critical care time)  Medications Ordered in UC Medications  ipratropium-albuterol (DUONEB) 0.5-2.5 (3) MG/3ML nebulizer solution 3 mL (3 mLs Nebulization Given 11/12/17 1700)     Initial Impression / Assessment and Plan / UC Course  I have reviewed the triage vital signs and the nursing notes.  Pertinent labs & imaging results that were available during my care of the patient were reviewed by me and considered in my medical decision making (see chart for details).      Final Clinical Impressions(s) / UC Diagnoses   Final diagnoses:  Asthma with acute exacerbation, unspecified asthma severity, unspecified whether persistent    ED Discharge Orders        Ordered    predniSONE (DELTASONE) 20 MG tablet     11/12/17 1732    doxycycline  (VIBRA-TABS) 100 MG tablet  2 times daily     11/12/17 1732     1. diagnosis reviewed with patient; given duoneb with improvement of symptoms 2. rx as per orders above; reviewed possible side effects, interactions, risks and benefits  3. Recommend continue current home inhalers 4. Follow-up prn if symptoms worsen or don't improve   Controlled Substance  Prescriptions [place] Controlled Substance Registry consulted? Not Applicable   Payton Mccallum, MD 11/12/17 [phone]

## 2017-11-12 NOTE — ED Triage Notes (Signed)
Patient complains of cough, congestion, wheezing and shortness of breath that started 3 days ago and has been worsening.

## 2017-11-15 ENCOUNTER — Telehealth: Payer: Self-pay | Admitting: Emergency Medicine

## 2017-11-15 NOTE — Telephone Encounter (Signed)
Called to follow up with patient after her recent visit. Patient states she is feeling a lot better.

## 2017-11-19 ENCOUNTER — Ambulatory Visit
Admission: RE | Admit: 2017-11-19 | Discharge: 2017-11-19 | Disposition: A | Discharge status: Home / Self Care | Payer: Managed Care, Other (non HMO) | Source: Ambulatory Visit | Attending: Family Medicine | Admitting: Family Medicine

## 2017-11-19 DIAGNOSIS — Z780 Asymptomatic menopausal state: Secondary | ICD-10-CM | POA: Diagnosis present

## 2017-11-19 DIAGNOSIS — Z1231 Encounter for screening mammogram for malignant neoplasm of breast: Secondary | ICD-10-CM | POA: Diagnosis not present

## 2017-11-19 DIAGNOSIS — Z1239 Encounter for other screening for malignant neoplasm of breast: Secondary | ICD-10-CM

## 2017-11-19 DIAGNOSIS — M85851 Other specified disorders of bone density and structure, right thigh: Secondary | ICD-10-CM | POA: Diagnosis not present

## 2018-12-19 IMAGING — MG MM DIGITAL SCREENING BILAT W/ TOMO W/ CAD
8 of 12 series · 8 of 28 positions shown · non-contrast
Comparison: Previous exam(s).

CLINICAL DATA: Screening.

EXAM:
2D DIGITAL SCREENING BILATERAL MAMMOGRAM WITH 3D TOMO WITH CAD

[L CC]
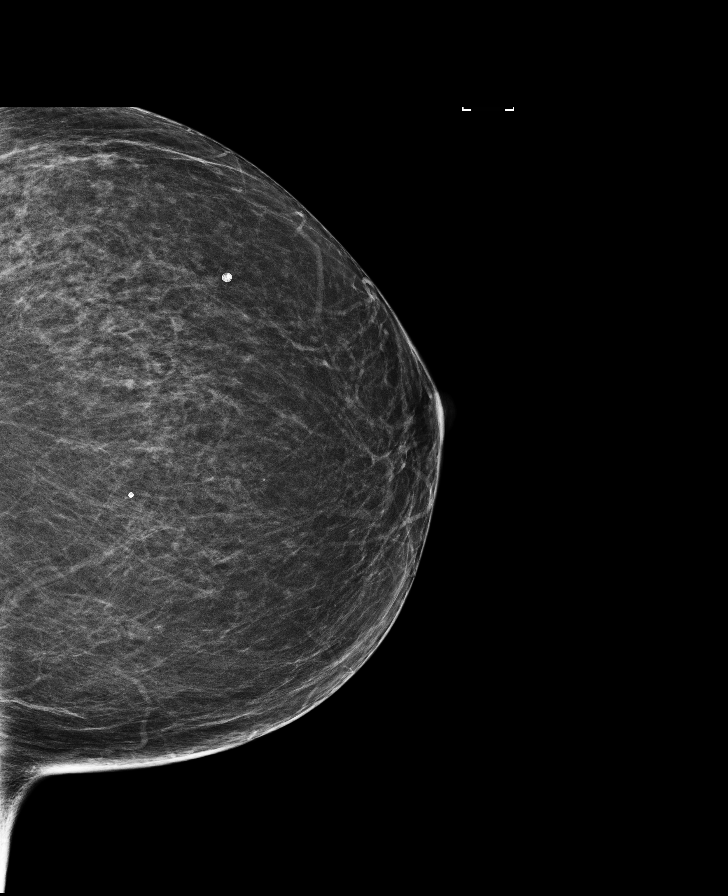

[R MLO]
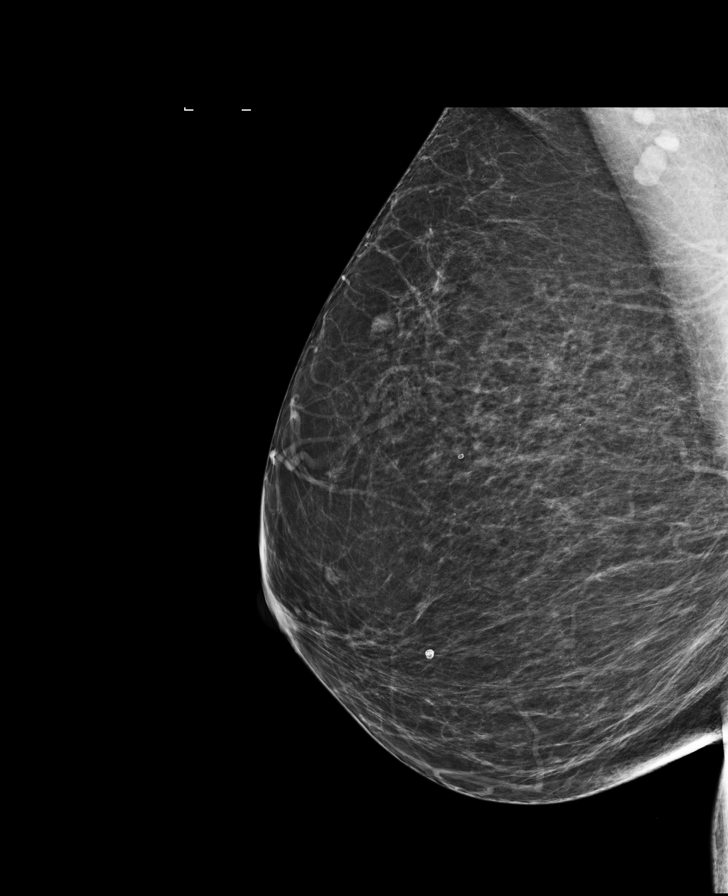

[L MLO]
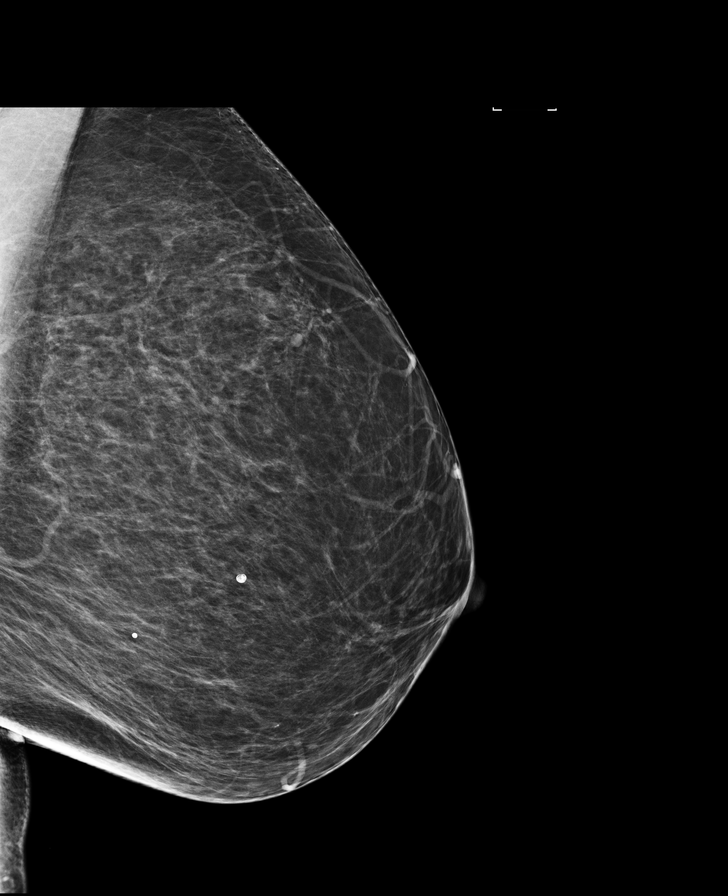

[R MLO synth-2D]
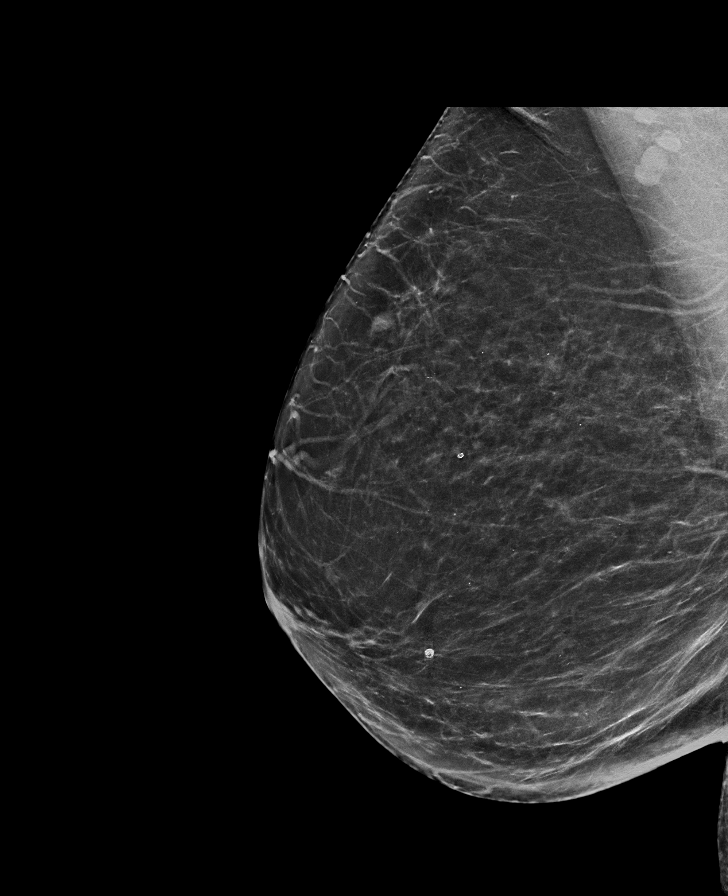

[L CC synth-2D]
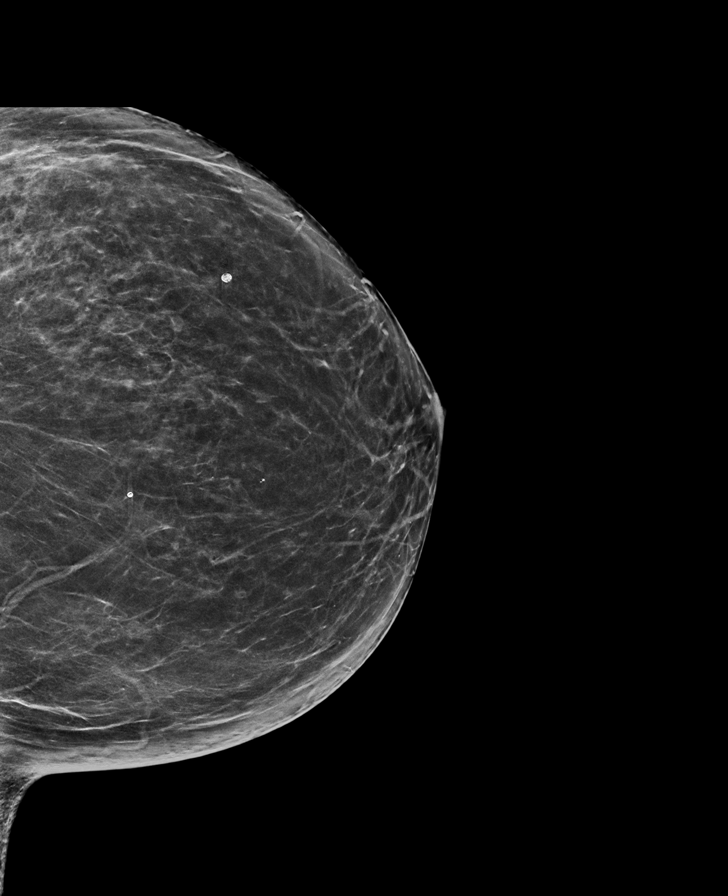

[R CC]
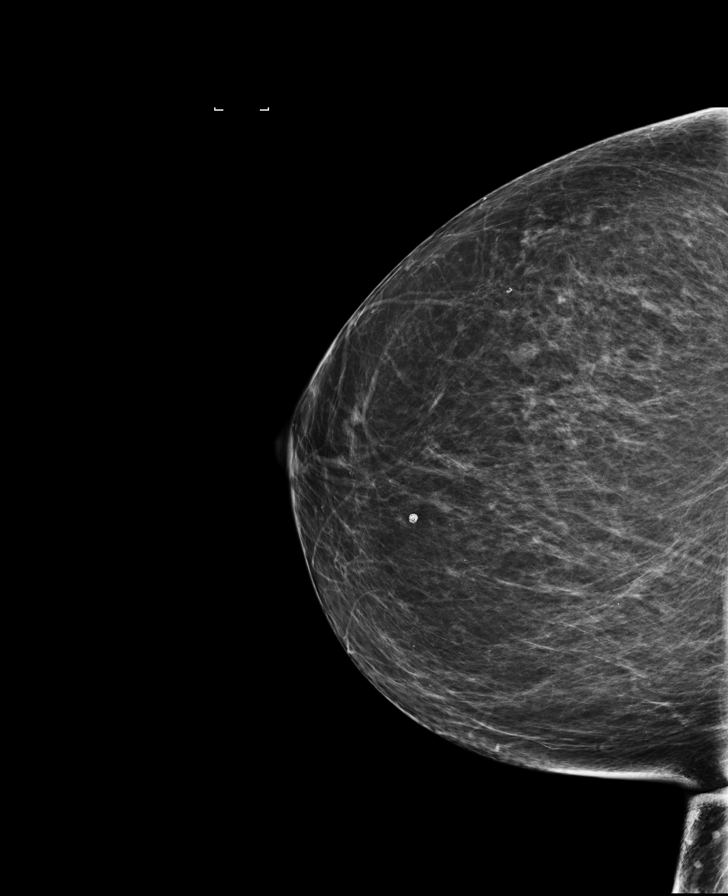

[L MLO synth-2D]
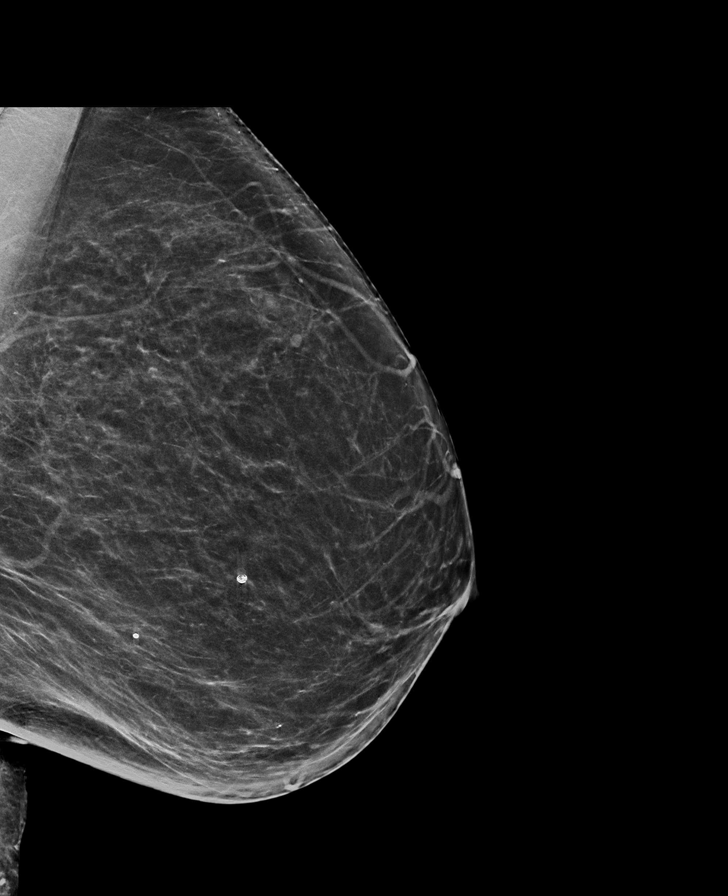

[R CC synth-2D]
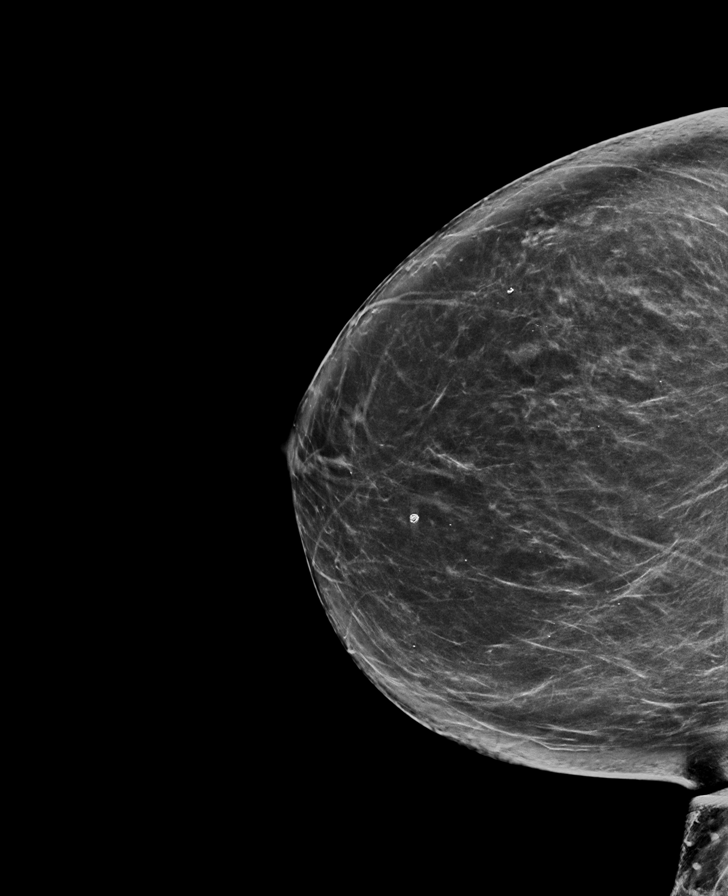

[8 of 28 positions shown; findings below may reference images not displayed]

ACR Breast Density Category b: There are scattered areas of
fibroglandular density.
FINDINGS: There are no findings suspicious for malignancy. Images were
processed with CAD.
IMPRESSION: No mammographic evidence of malignancy. A result letter of this
screening mammogram will be mailed directly to the patient.

RECOMMENDATION:
Screening mammogram in one year. (Code:GE-P-ZS0)

BI-RADS CATEGORY  1: Negative.

## 2020-01-13 ENCOUNTER — Other Ambulatory Visit: Payer: Self-pay | Admitting: Family Medicine

## 2020-01-13 DIAGNOSIS — Z1231 Encounter for screening mammogram for malignant neoplasm of breast: Secondary | ICD-10-CM

## 2020-01-19 ENCOUNTER — Other Ambulatory Visit: Payer: Self-pay | Admitting: Family Medicine

## 2020-01-19 DIAGNOSIS — M85851 Other specified disorders of bone density and structure, right thigh: Secondary | ICD-10-CM

## 2020-02-23 ENCOUNTER — Other Ambulatory Visit: Payer: Self-pay

## 2020-02-23 ENCOUNTER — Ambulatory Visit
Admission: RE | Admit: 2020-02-23 | Discharge: 2020-02-23 | Disposition: A | Discharge status: Home / Self Care | Payer: BC Managed Care – PPO | Source: Ambulatory Visit | Attending: Family Medicine | Admitting: Family Medicine

## 2020-02-23 DIAGNOSIS — Z1231 Encounter for screening mammogram for malignant neoplasm of breast: Secondary | ICD-10-CM | POA: Insufficient documentation

## 2020-03-05 ENCOUNTER — Other Ambulatory Visit: Payer: Self-pay | Admitting: *Deleted

## 2020-03-05 ENCOUNTER — Inpatient Hospital Stay
Admission: RE | Admit: 2020-03-05 | Discharge: 2020-03-05 | Disposition: A | Discharge status: Home / Self Care | Payer: Self-pay | Source: Ambulatory Visit | Attending: *Deleted | Admitting: *Deleted

## 2020-03-05 DIAGNOSIS — Z1231 Encounter for screening mammogram for malignant neoplasm of breast: Secondary | ICD-10-CM

## 2020-12-10 ENCOUNTER — Other Ambulatory Visit: Payer: Self-pay | Admitting: Family Medicine

## 2020-12-10 DIAGNOSIS — Z1231 Encounter for screening mammogram for malignant neoplasm of breast: Secondary | ICD-10-CM

## 2021-02-23 ENCOUNTER — Ambulatory Visit
Admission: RE | Admit: 2021-02-23 | Discharge: 2021-02-23 | Disposition: A | Discharge status: Home / Self Care | Payer: Medicare HMO | Source: Ambulatory Visit | Attending: Family Medicine | Admitting: Family Medicine

## 2021-02-23 ENCOUNTER — Other Ambulatory Visit: Payer: Self-pay

## 2021-02-23 DIAGNOSIS — Z1231 Encounter for screening mammogram for malignant neoplasm of breast: Secondary | ICD-10-CM | POA: Diagnosis not present

## 2021-03-03 ENCOUNTER — Other Ambulatory Visit: Payer: Self-pay | Admitting: Family Medicine

## 2021-03-03 DIAGNOSIS — M85851 Other specified disorders of bone density and structure, right thigh: Secondary | ICD-10-CM

## 2021-09-16 ENCOUNTER — Encounter: Payer: Self-pay | Admitting: *Deleted

## 2021-09-19 ENCOUNTER — Ambulatory Visit: Payer: Medicare HMO | Admitting: Anesthesiology

## 2021-09-19 ENCOUNTER — Encounter
Admission: RE | Disposition: A | Discharge status: Home / Self Care | Payer: Self-pay | Source: Home / Self Care | Attending: Gastroenterology

## 2021-09-19 ENCOUNTER — Ambulatory Visit
Admission: RE | Admit: 2021-09-19 | Discharge: 2021-09-19 | Disposition: A | Discharge status: Home / Self Care | Payer: Medicare HMO | Attending: Gastroenterology | Admitting: Gastroenterology

## 2021-09-19 DIAGNOSIS — J449 Chronic obstructive pulmonary disease, unspecified: Secondary | ICD-10-CM | POA: Diagnosis not present

## 2021-09-19 DIAGNOSIS — E785 Hyperlipidemia, unspecified: Secondary | ICD-10-CM | POA: Insufficient documentation

## 2021-09-19 DIAGNOSIS — K5730 Diverticulosis of large intestine without perforation or abscess without bleeding: Secondary | ICD-10-CM | POA: Diagnosis not present

## 2021-09-19 DIAGNOSIS — K219 Gastro-esophageal reflux disease without esophagitis: Secondary | ICD-10-CM | POA: Diagnosis not present

## 2021-09-19 DIAGNOSIS — Z90710 Acquired absence of both cervix and uterus: Secondary | ICD-10-CM | POA: Insufficient documentation

## 2021-09-19 DIAGNOSIS — Z1211 Encounter for screening for malignant neoplasm of colon: Secondary | ICD-10-CM | POA: Insufficient documentation

## 2021-09-19 DIAGNOSIS — K640 First degree hemorrhoids: Secondary | ICD-10-CM | POA: Insufficient documentation

## 2021-09-19 HISTORY — DX: Chronic obstructive pulmonary disease, unspecified: J44.9

## 2021-09-19 HISTORY — PX: COLONOSCOPY WITH PROPOFOL: SHX5780

## 2021-09-19 HISTORY — DX: Unspecified asthma, uncomplicated: J45.909

## 2021-09-19 HISTORY — DX: Gastro-esophageal reflux disease without esophagitis: K21.9

## 2021-09-19 SURGERY — COLONOSCOPY WITH PROPOFOL
Anesthesia: General

## 2021-09-19 MED ORDER — PROPOFOL 500 MG/50ML IV EMUL
INTRAVENOUS | Status: DC | PRN
  Administered 2021-09-19: 50 ug/kg/min via INTRAVENOUS

## 2021-09-19 MED ORDER — MIDAZOLAM HCL 2 MG/2ML IJ SOLN
INTRAMUSCULAR | Status: DC | PRN
  Administered 2021-09-19: 2 mg via INTRAVENOUS

## 2021-09-19 MED ORDER — PROPOFOL 10 MG/ML IV BOLUS
INTRAVENOUS | Status: DC | PRN
  Administered 2021-09-19: 20 mg via INTRAVENOUS

## 2021-09-19 MED ORDER — FENTANYL CITRATE (PF) 100 MCG/2ML IJ SOLN
INTRAMUSCULAR | Status: AC
  Filled 2021-09-19: qty 2

## 2021-09-19 MED ORDER — FENTANYL CITRATE (PF) 100 MCG/2ML IJ SOLN
INTRAMUSCULAR | Status: DC | PRN
  Administered 2021-09-19 (×4): 25 ug via INTRAVENOUS

## 2021-09-19 MED ORDER — LIDOCAINE HCL (CARDIAC) PF 100 MG/5ML IV SOSY
PREFILLED_SYRINGE | INTRAVENOUS | Status: DC | PRN
  Administered 2021-09-19: 50 mg via INTRAVENOUS

## 2021-09-19 MED ORDER — MIDAZOLAM HCL 2 MG/2ML IJ SOLN
INTRAMUSCULAR | Status: AC
  Filled 2021-09-19: qty 2

## 2021-09-19 MED ORDER — SODIUM CHLORIDE 0.9 % IV SOLN
INTRAVENOUS | Status: DC
  Administered 2021-09-19: 20 mL/h via INTRAVENOUS

## 2021-09-19 NOTE — Op Note (Signed)
[facility] Patient Name: Rachael Mills Procedure Date: 09/19/2021 10:25 AM MRN: [id number] Account #: [id number] Date of Birth: 1946-06-27 Admit Type: Outpatient Age: 75 Room: [facility] ENDO ROOM 3 Gender: Female Note Status: Finalized Instrument Name: Jasper Riling [id number] Procedure:             Colonoscopy Indications:           Screening for colorectal malignant neoplasm Providers:             Andrey Farmer MD, MD Referring MD:          Gayland Curry MD, MD (Referring MD) Medicines:             Monitored Anesthesia Care Complications:         No immediate complications. Procedure:             Pre-Anesthesia Assessment:                        - Prior to the procedure, a History and Physical was                         performed, and patient medications and allergies were                         reviewed. The patient is competent. The risks and                         benefits of the procedure and the sedation options and                         risks were discussed with the patient. All questions                         were answered and informed consent was obtained.                         Patient identification and proposed procedure were                         verified by the physician, the nurse, the anesthetist                         and the technician in the endoscopy suite. Mental                         Status Examination: alert and oriented. Airway                         Examination: normal oropharyngeal airway and neck                         mobility. Respiratory Examination: clear to                         auscultation. CV Examination: normal. Prophylactic                         Antibiotics: The patient does not require prophylactic  antibiotics. Prior Anticoagulants: The patient has                         taken no previous anticoagulant or antiplatelet                         agents. ASA Grade  Assessment: II - A patient with mild                         systemic disease. After reviewing the risks and                         benefits, the patient was deemed in satisfactory                         condition to undergo the procedure. The anesthesia                         plan was to use monitored anesthesia care (MAC).                         Immediately prior to administration of medications,                         the patient was re-assessed for adequacy to receive                         sedatives. The heart rate, respiratory rate, oxygen                         saturations, blood pressure, adequacy of pulmonary                         ventilation, and response to care were monitored                         throughout the procedure. The physical status of the                         patient was re-assessed after the procedure.                        After obtaining informed consent, the colonoscope was                         passed under direct vision. Throughout the procedure,                         the patient's blood pressure, pulse, and oxygen                         saturations were monitored continuously. The                         Colonoscope was introduced through the anus and                         advanced to the the cecum, identified by appendiceal  orifice and ileocecal valve. The colonoscopy was                         performed without difficulty. The patient tolerated                         the procedure well. The quality of the bowel                         preparation was good. Findings:      The perianal and digital rectal examinations were normal.      Multiple small and large-mouthed diverticula were found in the sigmoid       colon.      Internal hemorrhoids were found during retroflexion. The hemorrhoids       were Grade I (internal hemorrhoids that do not prolapse).      The exam was otherwise without abnormality on direct  and retroflexion       views. Impression:            - Diverticulosis in the sigmoid colon.                        - Internal hemorrhoids.                        - The examination was otherwise normal on direct and                         retroflexion views.                        - No specimens collected. Recommendation:        - Discharge patient to home.                        - Resume previous diet.                        - Continue present medications.                        - Repeat colonoscopy is not recommended due to current                         age (29 years or older) for screening purposes.                        - Return to referring physician as previously                         scheduled. Procedure Code(s):     --- Professional ---                        Z3007, Colorectal cancer screening; colonoscopy on                         individual not meeting criteria for high risk Diagnosis Code(s):     --- Professional ---                        Z12.11,  Encounter for screening for malignant neoplasm                         of colon                        K64.0, First degree hemorrhoids                        K57.30, Diverticulosis of large intestine without                         perforation or abscess without bleeding CPT copyright 2019 American Medical Association. All rights reserved. The codes documented in this report are preliminary and upon coder review may  be revised to meet current compliance requirements. Andrey Farmer MD, MD 09/19/2021 10:52:05 AM Number of Addenda: 0 Note Initiated On: 09/19/2021 10:25 AM Scope Withdrawal Time: 0 hours 9 minutes 14 seconds  Total Procedure Duration: 0 hours 16 minutes 57 seconds  Estimated Blood Loss:  Estimated blood loss: none.      [facility]

## 2021-09-19 NOTE — Interval H&P Note (Signed)
History and Physical Interval Note:  09/19/2021 10:19 AM  Rachael Mills  has presented today for surgery, with the diagnosis of Colon Cancer Screening (Z12.11).  The various methods of treatment have been discussed with the patient and family. After consideration of risks, benefits and other options for treatment, the patient has consented to  Procedure(s): COLONOSCOPY WITH PROPOFOL (N/A) as a surgical intervention.  The patient's history has been reviewed, patient examined, no change in status, stable for surgery.  I have reviewed the patient's chart and labs.  Questions were answered to the patient's satisfaction.     Regis Bill  Ok to proceed with colonoscopy

## 2021-09-19 NOTE — Anesthesia Preprocedure Evaluation (Signed)
Anesthesia Evaluation  Patient identified by MRN, date of birth, ID band Patient awake    Reviewed: Allergy & Precautions, H&P , NPO status , Patient's Chart, lab work & pertinent test results, reviewed documented beta blocker date and time   Airway Mallampati: II   Neck ROM: full    Dental  (+) Poor Dentition   Pulmonary asthma , COPD, former smoker,    Pulmonary exam normal        Cardiovascular Exercise Tolerance: Good negative cardio ROS Normal cardiovascular exam Rhythm:regular Rate:Normal     Neuro/Psych negative neurological ROS  negative psych ROS   GI/Hepatic Neg liver ROS, GERD  Medicated,  Endo/Other  negative endocrine ROS  Renal/GU negative Renal ROS  negative genitourinary   Musculoskeletal   Abdominal   Peds  Hematology negative hematology ROS (+)   Anesthesia Other Findings Past Medical History: No date: Arthritis No date: Asthma No date: COPD (chronic obstructive pulmonary disease) (HCC) No date: GERD (gastroesophageal reflux disease) No date: High cholesterol Past Surgical History: No date: ABDOMINAL HYSTERECTOMY 2018: EYE SURGERY; Bilateral BMI    Body Mass Index: 24.50 kg/m     Reproductive/Obstetrics negative OB ROS                             Anesthesia Physical Anesthesia Plan  ASA: 3  Anesthesia Plan: General   Post-op Pain Management:    Induction:   PONV Risk Score and Plan:   Airway Management Planned:   Additional Equipment:   Intra-op Plan:   Post-operative Plan:   Informed Consent: I have reviewed the patients History and Physical, chart, labs and discussed the procedure including the risks, benefits and alternatives for the proposed anesthesia with the patient or authorized representative who has indicated his/her understanding and acceptance.     Dental Advisory Given  Plan Discussed with: CRNA  Anesthesia Plan Comments:          Anesthesia Quick Evaluation

## 2021-09-19 NOTE — H&P (Signed)
Outpatient short stay form Pre-procedure 09/19/2021  Regis Bill, MD  Primary Physician: Leim Fabry, MD  Reason for visit:  Screening  History of present illness:   75 y/o lady with history of HLD here for screening colonoscopy. No blood thinners. No family history of GI malignancies. History of hysterectomy. No new symptoms.    Current Facility-Administered Medications:    0.9 %  sodium chloride infusion, , Intravenous, Continuous, Aubrionna Istre, Rossie Muskrat, MD, Last Rate: 20 mL/hr at 09/19/21 1007, 20 mL/hr at 09/19/21 1007  Medications Prior to Admission  Medication Sig Dispense Refill Last Dose   albuterol (PROVENTIL HFA;VENTOLIN HFA) 108 (90 BASE) MCG/ACT inhaler Inhale into the lungs every 6 (six) hours as needed for wheezing or shortness of breath.   Past Week   benzonatate (TESSALON PERLES) 100 MG capsule Take 1 capsule (100 mg total) by mouth 3 (three) times daily as needed for cough. 15 capsule 0 Past Week   CHOLECALCIFEROL PO Take 2,000 Units by mouth daily.   Past Week   doxycycline (VIBRA-TABS) 100 MG tablet Take 1 tablet (100 mg total) by mouth 2 (two) times daily. 20 tablet 0 Past Week   lovastatin (MEVACOR) 20 MG tablet Take 40 mg by mouth at bedtime.   Past Week   metaxalone (SKELAXIN) 800 MG tablet Take 1 tablet (800 mg total) by mouth 3 (three) times daily. 21 tablet 0 Past Week   Multiple Vitamin (MULTIVITAMIN) tablet Take 1 tablet by mouth daily.   Past Week   naproxen sodium (ANAPROX) 550 MG tablet Take 1 tablet (550 mg total) by mouth 2 (two) times daily with a meal. 30 tablet 0 Past Week   predniSONE (DELTASONE) 20 MG tablet 3 tabs po qd x 2 days, then 2 tabs po qd x 3 days, then 1 tab po qd x 3 days, then half a tab po qd x 2 days 16 tablet 0 Past Week   sertraline (ZOLOFT) 50 MG tablet Take 50 mg by mouth daily.   Past Week   traZODone (DESYREL) 150 MG tablet Take by mouth at bedtime.   Past Week   fluticasone-salmeterol (ADVAIR HFA) 45-21 MCG/ACT inhaler  Inhale 2 puffs into the lungs 2 (two) times daily.        Allergies  Allergen Reactions   Codeine Nausea Only     Past Medical History:  Diagnosis Date   Arthritis    Asthma    COPD (chronic obstructive pulmonary disease) (HCC)    GERD (gastroesophageal reflux disease)    High cholesterol     Review of systems:  Otherwise negative.    Physical Exam  Gen: Alert, oriented. Appears stated age.  HEENT: PERRLA. Lungs: No respiratory distress CV: RRR Abd: soft, benign, no masses Ext: No edema    Planned procedures: Proceed with colonoscopy. The patient understands the nature of the planned procedure, indications, risks, alternatives and potential complications including but not limited to bleeding, infection, perforation, damage to internal organs and possible oversedation/side effects from anesthesia. The patient agrees and gives consent to proceed.  Please refer to procedure notes for findings, recommendations and patient disposition/instructions.     Regis Bill, MD [facility]

## 2021-09-19 NOTE — Transfer of Care (Signed)
Immediate Anesthesia Transfer of Care Note  Patient: Rachael Mills  Procedure(s) Performed: COLONOSCOPY WITH PROPOFOL  Patient Location: PACU  Anesthesia Type:General  Level of Consciousness: sedated  Airway & Oxygen Therapy: Patient Spontanous Breathing and Patient connected to nasal cannula oxygen  Post-op Assessment: Report given to RN and Post -op Vital signs reviewed and stable  Post vital signs: Reviewed and stable  Last Vitals:  Vitals Value Taken Time  BP 122/83 09/19/21 1051  Temp 36.4 C 09/19/21 1051  Pulse 63 09/19/21 1053  Resp 6 09/19/21 1053  SpO2 97 % 09/19/21 1053  Vitals shown include unvalidated device data.  Last Pain:  Vitals:   09/19/21 1051  TempSrc: Temporal  PainSc: Asleep         Complications: No notable events documented.

## 2021-09-20 ENCOUNTER — Encounter: Payer: Self-pay | Admitting: Gastroenterology

## 2021-09-20 NOTE — Anesthesia Postprocedure Evaluation (Signed)
Anesthesia Post Note  Patient: Rachael Mills  Procedure(s) Performed: COLONOSCOPY WITH PROPOFOL  Patient location during evaluation: PACU Anesthesia Type: General Level of consciousness: awake and alert Pain management: pain level controlled Vital Signs Assessment: post-procedure vital signs reviewed and stable Respiratory status: spontaneous breathing, nonlabored ventilation, respiratory function stable and patient connected to nasal cannula oxygen Cardiovascular status: blood pressure returned to baseline and stable Postop Assessment: no apparent nausea or vomiting Anesthetic complications: no   No notable events documented.   Last Vitals:  Vitals:   09/19/21 0958 09/19/21 1051  BP: (!) 156/80 122/83  Pulse: 77 61  Resp: 20 (!) 8  Temp: (!) 35.9 C (!) 36.4 C  SpO2: 97% 97%    Last Pain:  Vitals:   09/20/21 0827  TempSrc:   PainSc: 0-No pain                 Yevette Edwards

## 2021-10-28 ENCOUNTER — Encounter: Payer: Self-pay | Admitting: Emergency Medicine

## 2021-10-28 ENCOUNTER — Ambulatory Visit
Admission: EM | Admit: 2021-10-28 | Discharge: 2021-10-28 | Disposition: A | Discharge status: Home / Self Care | Payer: Medicare (Managed Care) | Attending: Emergency Medicine | Admitting: Emergency Medicine

## 2021-10-28 ENCOUNTER — Other Ambulatory Visit: Payer: Self-pay

## 2021-10-28 DIAGNOSIS — U071 COVID-19: Secondary | ICD-10-CM | POA: Insufficient documentation

## 2021-10-28 LAB — RESP PANEL BY RT-PCR (FLU A&B, COVID) ARPGX2
Influenza A by PCR: NEGATIVE
Influenza B by PCR: NEGATIVE
SARS Coronavirus 2 by RT PCR: POSITIVE — AB

## 2021-10-28 MED ORDER — PROMETHAZINE-DM 6.25-15 MG/5ML PO SYRP: 5.0000 mL | ORAL_SOLUTION | Freq: Four times a day (QID) | ORAL | 0 refills | Status: DC | PRN

## 2021-10-28 MED ORDER — MOLNUPIRAVIR EUA 200MG CAPSULE: 4.0000 | ORAL_CAPSULE | Freq: Two times a day (BID) | ORAL | 0 refills | Status: AC

## 2021-10-28 MED ORDER — BENZONATATE 100 MG PO CAPS: 200.0000 mg | ORAL_CAPSULE | Freq: Three times a day (TID) | ORAL | 0 refills | Status: DC

## 2021-10-28 NOTE — ED Triage Notes (Signed)
Patient c/o cough, bodyaches and sore throat that started yesterday.  Patient states that she took a home covid test and was negative yesterday.

## 2021-10-28 NOTE — ED Provider Notes (Signed)
[facility]    CSN: [id number] Arrival date & time: 10/28/21  1120      History   Chief Complaint Chief Complaint  Patient presents with   Sore Throat   Cough    HPI Rachael Mills is a 76 y.o. female.   HPI  30 old female here for evaluation of respiratory complaints.  Orts that she has been experiencing body aches, sore throat, nonproductive cough, shortness of breath, and wheezing that started yesterday.  She does have a history of asthma and she has been using her rescue inhaler which is helping with her symptoms.  She denies any measured fever at home, runny nose, nasal congestion, ear pain, headache, GI complaints, or known sick contacts.  Past Medical History:  Diagnosis Date   Arthritis    Asthma    COPD (chronic obstructive pulmonary disease) (HCC)    GERD (gastroesophageal reflux disease)    High cholesterol     There are no problems to display for this patient.   Past Surgical History:  Procedure Laterality Date   ABDOMINAL HYSTERECTOMY     COLONOSCOPY WITH PROPOFOL N/A 09/19/2021   Procedure: COLONOSCOPY WITH PROPOFOL;  Surgeon: Lesly Rubenstein, MD;  Location: [facility] ENDOSCOPY;  Service: Endoscopy;  Laterality: N/A;   EYE SURGERY Bilateral 2018    OB History   No obstetric history on file.      Home Medications    Prior to Admission medications   Medication Sig Start Date End Date Taking? Authorizing Provider  albuterol (PROVENTIL HFA;VENTOLIN HFA) 108 (90 BASE) MCG/ACT inhaler Inhale into the lungs every 6 (six) hours as needed for wheezing or shortness of breath.   Yes [provider]  benzonatate (TESSALON) 100 MG capsule Take 2 capsules (200 mg total) by mouth every 8 (eight) hours. 10/28/21  Yes Margarette Canada, NP  Cholecalciferol 50 MCG (2000 UT) CAPS Take by mouth.   Yes [provider]  fluticasone-salmeterol (ADVAIR HFA) 45-21 MCG/ACT inhaler Inhale 2 puffs into the lungs 2 (two) times daily.   Yes  [provider]  lovastatin (MEVACOR) 20 MG tablet Take 40 mg by mouth at bedtime.   Yes [provider]  metaxalone (SKELAXIN) 800 MG tablet Take 1 tablet (800 mg total) by mouth 3 (three) times daily. 05/09/15  Yes Betancourt, Aura Fey, NP  molnupiravir EUA (LAGEVRIO) 200 mg CAPS capsule Take 4 capsules (800 mg total) by mouth 2 (two) times daily for 5 days. 10/28/21 11/02/21 Yes Margarette Canada, NP  Multiple Vitamin (MULTIVITAMIN) tablet Take 1 tablet by mouth daily.   Yes [provider]  prednisoLONE acetate (PRED FORTE) 1 % ophthalmic suspension  10/27/21  Yes [provider]  promethazine-dextromethorphan (PROMETHAZINE-DM) 6.25-15 MG/5ML syrup Take 5 mLs by mouth 4 (four) times daily as needed. 10/28/21  Yes Margarette Canada, NP  sertraline (ZOLOFT) 50 MG tablet Take 50 mg by mouth daily.   Yes [provider]  traZODone (DESYREL) 150 MG tablet Take by mouth at bedtime.   Yes [provider]  FLOVENT HFA 110 MCG/ACT inhaler 1 puff 2 (two) times daily. 07/31/21   [provider]  naproxen sodium (ANAPROX) 550 MG tablet Take 1 tablet (550 mg total) by mouth 2 (two) times daily with a meal. 05/09/15   Betancourt, Aura Fey, NP    Family History Family History  Problem Relation Age of Onset   Deep vein thrombosis Mother    Cancer Father    Breast cancer Neg Hx  Social History Social History   Tobacco Use   Smoking status: Former   Smokeless tobacco: Never  Scientific laboratory technician Use: Never used  Substance Use Topics   Alcohol use: No   Drug use: No     Allergies   Codeine   Review of Systems Review of Systems  Constitutional:  Negative for activity change, appetite change and fever.  HENT:  Positive for sore throat. Negative for congestion, ear pain and rhinorrhea.   Respiratory:  Positive for cough, shortness of breath and wheezing.   Gastrointestinal:  Negative for diarrhea, nausea and vomiting.  Musculoskeletal:  Positive  for arthralgias and myalgias.  Skin:  Negative for rash.  Neurological:  Negative for headaches.  Hematological: Negative.   Psychiatric/Behavioral: Negative.      Physical Exam Triage Vital Signs ED Triage Vitals  Enc Vitals Group     BP 10/28/21 1131 140/72     Pulse Rate 10/28/21 1131 99     Resp 10/28/21 1131 14     Temp 10/28/21 1131 99.1 F (37.3 C)     Temp Source 10/28/21 1131 Oral     SpO2 10/28/21 1131 96 %     Weight 10/28/21 1127 147 lb (66.7 kg)     Height 10/28/21 1127 5' 4.5" (1.638 m)     Head Circumference --      Peak Flow --      Pain Score 10/28/21 1127 6     Pain Loc --      Pain Edu? --      Excl. in [place]? --    No data found.  Updated Vital Signs BP 140/72 (BP Location: Left Arm)    Pulse 99    Temp 99.1 F (37.3 C) (Oral)    Resp 14    Ht 5' 4.5" (1.638 m)    Wt 147 lb (66.7 kg)    SpO2 96%    BMI 24.84 kg/m   Visual Acuity Right Eye Distance:   Left Eye Distance:   Bilateral Distance:    Right Eye Near:   Left Eye Near:    Bilateral Near:     Physical Exam Vitals and nursing note reviewed.  Constitutional:      General: She is not in acute distress.    Appearance: Normal appearance. She is not ill-appearing.  HENT:     Head: Normocephalic and atraumatic.     Right Ear: Tympanic membrane, ear canal and external ear normal. There is no impacted cerumen.     Left Ear: Tympanic membrane, ear canal and external ear normal. There is no impacted cerumen.     Nose: Congestion and rhinorrhea present.     Mouth/Throat:     Mouth: Mucous membranes are moist.     Pharynx: Oropharynx is clear. Posterior oropharyngeal erythema present.  Cardiovascular:     Rate and Rhythm: Normal rate and regular rhythm.     Pulses: Normal pulses.     Heart sounds: Normal heart sounds. No murmur heard.   No friction rub. No gallop.  Pulmonary:     Effort: Pulmonary effort is normal.     Breath sounds: Wheezing present. No rhonchi or rales.  Musculoskeletal:      Cervical back: Normal range of motion and neck supple.  Lymphadenopathy:     Cervical: No cervical adenopathy.  Skin:    General: Skin is warm and dry.     Capillary Refill: Capillary refill takes less than 2 seconds.  Findings: No erythema or rash.  Neurological:     General: No focal deficit present.     Mental Status: She is alert and oriented to person, place, and time.  Psychiatric:        Mood and Affect: Mood normal.        Behavior: Behavior normal.        Thought Content: Thought content normal.        Judgment: Judgment normal.     UC Treatments / Results  Labs (all labs ordered are listed, but only abnormal results are displayed) Labs Reviewed  RESP PANEL BY RT-PCR (FLU A&B, COVID) ARPGX2 - Abnormal; Notable for the following components:      Result Value   SARS Coronavirus 2 by RT PCR POSITIVE (*)    All other components within normal limits    EKG   Radiology No results found.  Procedures Procedures (including critical care time)  Medications Ordered in UC Medications - No data to display  Initial Impression / Assessment and Plan / UC Course  I have reviewed the triage vital signs and the nursing notes.  Pertinent labs & imaging results that were available during my care of the patient were reviewed by me and considered in my medical decision making (see chart for details).  Patient is a nontoxic-appearing 18 old female here for evaluation of respiratory complaints as outlined in HPI above.  She is unaware of any sick contacts but does have a history of asthma.  She has been using her rescue inhaler which has been helping with her shortness breath and wheezing.  Physical exam reveals pearly gray tympanic membranes bilaterally with normal light reflex and clear external auditory canals.  Nasal mucosa is edematous without significant erythema.  There is scant clear discharge in both nares.  Oropharyngeal exam reveals mild posterior oropharyngeal erythema  and injection with clear postnasal drip.  No cervical lymphadenopathy appreciable exam.  Cardiopulmonary exam reveals wheezes on expiration in bilateral bases.  Remainder lung fields are clear.  Respiratory triplex panel was collected at triage and is pending.  Respiratory triplex panel is positive for COVID.  Will discharge patient home to quarantine for 5 days due to her COVID-19 infection.  We will place patient on molnupiravir twice daily for 5 days for treatment of COVID-19 and will give Tessalon Perles and Promethazine DM cough syrup to help with cough symptoms.  She did not experiencing any nasal congestion so I do not feel that Atrovent nasal spray is needed at this time.  Patient is on both lovastatin and also Advair for her asthma and both are contraindicated with Paxlovid.   Final Clinical Impressions(s) / UC Diagnoses   Final diagnoses:  [id number]     Discharge Instructions      You will have to quarantine for 5 days from the start of your symptoms.  After 5 days you can break quarantine if your symptoms have improved and you have not had a fever for 24 hours without taking Tylenol or ibuprofen.  Use over-the-counter Tylenol and ibuprofen as needed for body aches and fever.  Take the molnupiravir twice daily for 5 days for treatment of COVID-19..  Use the Tessalon Perles every 8 hours during the day.  Take them with a small sip of water.  They may give you some numbness to the base of your tongue or a metallic taste in your mouth, this is normal.  Use the Promethazine DM cough syrup at bedtime for cough and  congestion.  It will make you drowsy so do not take it during the day.  If you develop any increased shortness of breath-especially at rest, you are unable to speak in full sentences, or is a late sign your lips are turning blue you need to go the ER for evaluation.      ED Prescriptions     Medication Sig Dispense Auth. Provider   benzonatate (TESSALON) 100 MG  capsule Take 2 capsules (200 mg total) by mouth every 8 (eight) hours. 21 capsule Margarette Canada, NP   promethazine-dextromethorphan (PROMETHAZINE-DM) 6.25-15 MG/5ML syrup Take 5 mLs by mouth 4 (four) times daily as needed. 118 mL Margarette Canada, NP   molnupiravir EUA (LAGEVRIO) 200 mg CAPS capsule Take 4 capsules (800 mg total) by mouth 2 (two) times daily for 5 days. 40 capsule Margarette Canada, NP      PDMP not reviewed this encounter.   Margarette Canada, NP 10/28/21 1302

## 2021-10-28 NOTE — Discharge Instructions (Addendum)
You will have to quarantine for 5 days from the start of your symptoms.  After 5 days you can break quarantine if your symptoms have improved and you have not had a fever for 24 hours without taking Tylenol or ibuprofen.  Use over-the-counter Tylenol and ibuprofen as needed for body aches and fever.  Take the molnupiravir twice daily for 5 days for treatment of COVID-19..  Use the Tessalon Perles every 8 hours during the day.  Take them with a small sip of water.  They may give you some numbness to the base of your tongue or a metallic taste in your mouth, this is normal.  Use the Promethazine DM cough syrup at bedtime for cough and congestion.  It will make you drowsy so do not take it during the day.  If you develop any increased shortness of breath-especially at rest, you are unable to speak in full sentences, or is a late sign your lips are turning blue you need to go the ER for evaluation.

## 2022-05-03 ENCOUNTER — Other Ambulatory Visit: Payer: Self-pay | Admitting: Family Medicine

## 2022-05-03 DIAGNOSIS — Z1231 Encounter for screening mammogram for malignant neoplasm of breast: Secondary | ICD-10-CM

## 2022-05-11 ENCOUNTER — Ambulatory Visit
Admission: RE | Admit: 2022-05-11 | Discharge: 2022-05-11 | Disposition: A | Discharge status: Home / Self Care | Payer: Medicare (Managed Care) | Source: Ambulatory Visit | Attending: Family Medicine | Admitting: Family Medicine

## 2022-05-11 DIAGNOSIS — Z1231 Encounter for screening mammogram for malignant neoplasm of breast: Secondary | ICD-10-CM | POA: Insufficient documentation

## 2022-06-02 ENCOUNTER — Ambulatory Visit
Admission: EM | Admit: 2022-06-02 | Discharge: 2022-06-02 | Disposition: A | Discharge status: Home / Self Care | Payer: Medicare (Managed Care) | Attending: Emergency Medicine | Admitting: Emergency Medicine

## 2022-06-02 DIAGNOSIS — J441 Chronic obstructive pulmonary disease with (acute) exacerbation: Secondary | ICD-10-CM | POA: Diagnosis not present

## 2022-06-02 MED ORDER — METHYLPREDNISOLONE SODIUM SUCC 40 MG IJ SOLR
60.0000 mg | Freq: Once | INTRAMUSCULAR | Status: AC
  Administered 2022-06-02: 60 mg via INTRAMUSCULAR

## 2022-06-02 MED ORDER — PREDNISONE 20 MG PO TABS: 40.0000 mg | ORAL_TABLET | Freq: Every day | ORAL | 0 refills | Status: DC

## 2022-06-02 MED ORDER — PROMETHAZINE-DM 6.25-15 MG/5ML PO SYRP: 5.0000 mL | ORAL_SOLUTION | Freq: Four times a day (QID) | ORAL | 0 refills | Status: DC | PRN

## 2022-06-02 MED ORDER — BENZONATATE 100 MG PO CAPS: 200.0000 mg | ORAL_CAPSULE | Freq: Three times a day (TID) | ORAL | 0 refills | Status: DC

## 2022-06-02 MED ORDER — AZITHROMYCIN 250 MG PO TABS: 250.0000 mg | ORAL_TABLET | Freq: Every day | ORAL | 0 refills | Status: DC

## 2022-06-02 MED ORDER — IPRATROPIUM-ALBUTEROL 0.5-2.5 (3) MG/3ML IN SOLN
3.0000 mL | Freq: Once | RESPIRATORY_TRACT | Status: AC
  Administered 2022-06-02: 3 mL via RESPIRATORY_TRACT

## 2022-06-02 NOTE — Discharge Instructions (Addendum)
Today you are being treated for inflammation to your upper airways  You have been given a steroid injection as well as a breathing treatment here in the office today  Starting tomorrow begin use of prednisone every morning with food for 5 days to help reduce inflammation   Take azithromycin as directed on packaging, this is to provide coverage for bacteria  Continue use of all inhalers as directed by your physician  You may take Tessalon pill every 8 hours to help calm your coughing  May use promethazine DM for coughing and additional comfort, be mindful this medication may make you drowsy  For worsening signs of breathing please go to the nearest emergency department for evaluation  In addition:  Maintaining adequate hydration may help to thin secretions and soothe the respiratory mucosa   Warm Liquids- Ingestion of warm liquids may have a soothing effect on the respiratory mucosa, increase the flow of nasal mucus, and loosen respiratory secretions, making them easier to remove  May try honey (2.5 to 5 mL [0.5 to 1 teaspoon]) can be given straight or diluted in liquid (juice). Corn syrup may be substituted if honey is not available.     If your symptoms persist you may follow-up with urgent care or your primary doctor however if your symptoms worsen you will need to go to the nearest emergency department for evaluation for intravenous medications

## 2022-06-02 NOTE — ED Provider Notes (Signed)
[facility]    CSN: [id number] Arrival date & time: 06/02/22  1045      History   Chief Complaint Chief Complaint  Patient presents with   Wheezing   Cough    HPI Rachael Mills is a 76 y.o. female.   Patient presents with a productive cough, wheezing and shortness of breath with exertion for 3 days.  Has attempted use of Flovent and Ventolin inhaler which has been temporarily effective.  History of COPD and asthma.  No known sick contacts.  Tolerating food and liquids.  Denies fever, chills, body aches, nasal congestion, rhinorrhea, ear pain, sore throat, chest pain or tightness.    Past Medical History:  Diagnosis Date   Arthritis    Asthma    COPD (chronic obstructive pulmonary disease) (HCC)    GERD (gastroesophageal reflux disease)    High cholesterol     There are no problems to display for this patient.   Past Surgical History:  Procedure Laterality Date   ABDOMINAL HYSTERECTOMY     BREAST EXCISIONAL BIOPSY Right    COLONOSCOPY WITH PROPOFOL N/A 09/19/2021   Procedure: COLONOSCOPY WITH PROPOFOL;  Surgeon: Regis Bill, MD;  Location: [facility] END[facility];  Service: Endoscopy;  Laterality: N/A;   EYE SURGERY Bilateral 2018    OB History   No obstetric history on file.      Home Medications    Prior to Admission medications   Medication Sig Start Date End Date Taking? Authorizing Provider  albuterol (PROVENTIL HFA;VENTOLIN HFA) 108 (90 BASE) MCG/ACT inhaler Inhale into the lungs every 6 (six) hours as needed for wheezing or shortness of breath.   Yes [provider]  Cholecalciferol 50 MCG (2000 UT) CAPS Take by mouth.   Yes [provider]  FLOVENT HFA 110 MCG/ACT inhaler 1 puff 2 (two) times daily. 07/31/21  Yes [provider]  fluticasone-salmeterol (ADVAIR HFA) 45-21 MCG/ACT inhaler Inhale 2 puffs into the lungs 2 (two) times daily.   Yes [provider]  lovastatin (MEVACOR) 20 MG tablet  Take 40 mg by mouth at bedtime.   Yes [provider]  metaxalone (SKELAXIN) 800 MG tablet Take 1 tablet (800 mg total) by mouth 3 (three) times daily. 05/09/15  Yes Betancourt, Jarold Song, NP  Multiple Vitamin (MULTIVITAMIN) tablet Take 1 tablet by mouth daily.   Yes [provider]  naproxen sodium (ANAPROX) 550 MG tablet Take 1 tablet (550 mg total) by mouth 2 (two) times daily with a meal. 05/09/15  Yes Betancourt, Jarold Song, NP  prednisoLONE acetate (PRED FORTE) 1 % ophthalmic suspension  10/27/21  Yes [provider]  sertraline (ZOLOFT) 50 MG tablet Take 50 mg by mouth daily.   Yes [provider]  traZODone (DESYREL) 150 MG tablet Take by mouth at bedtime.   Yes [provider]  benzonatate (TESSALON) 100 MG capsule Take 2 capsules (200 mg total) by mouth every 8 (eight) hours. 10/28/21   Becky Augusta, NP  promethazine-dextromethorphan (PROMETHAZINE-DM) 6.25-15 MG/5ML syrup Take 5 mLs by mouth 4 (four) times daily as needed. 10/28/21   Becky Augusta, NP    Family History Family History  Problem Relation Age of Onset   Deep vein thrombosis Mother    Cancer Father    Breast cancer Neg Hx     Social History Social History   Tobacco Use   Smoking status: Former   Smokeless tobacco: Never  Vaping Use   Vaping Use: Never used  Substance  Use Topics   Alcohol use: Yes   Drug use: No     Allergies   Codeine   Review of Systems Review of Systems  Constitutional: Negative.   HENT: Negative.    Respiratory:  Positive for cough, shortness of breath and wheezing. Negative for apnea, choking, chest tightness and stridor.   Cardiovascular: Negative.   Skin: Negative.   Neurological: Negative.      Physical Exam Triage Vital Signs ED Triage Vitals  Enc Vitals Group     BP 06/02/22 1056 134/77     Pulse Rate 06/02/22 1056 95     Resp 06/02/22 1056 18     Temp 06/02/22 1056 98 F (36.7 C)     Temp Source 06/02/22 1056 Oral     SpO2  06/02/22 1056 93 %     Weight 06/02/22 1055 150 lb (68 kg)     Height 06/02/22 1055 5\' 4"  (1.626 m)     Head Circumference --      Peak Flow --      Pain Score 06/02/22 1054 0     Pain Loc --      Pain Edu? --      Excl. in [place]? --    No data found.  Updated Vital Signs BP 134/77 (BP Location: Left Arm)   Pulse 95   Temp 98 F (36.7 C) (Oral)   Resp 18   Ht 5\' 4"  (1.626 m)   Wt 150 lb (68 kg)   SpO2 93%   BMI 25.75 kg/m   Visual Acuity Right Eye Distance:   Left Eye Distance:   Bilateral Distance:    Right Eye Near:   Left Eye Near:    Bilateral Near:     Physical Exam Constitutional:      Appearance: Normal appearance.  HENT:     Head: Normocephalic.  Eyes:     Extraocular Movements: Extraocular movements intact.  Cardiovascular:     Rate and Rhythm: Normal rate and regular rhythm.     Pulses: Normal pulses.     Heart sounds: Normal heart sounds.  Pulmonary:     Effort: Pulmonary effort is normal.     Breath sounds: Wheezing present.  Skin:    General: Skin is warm and dry.  Neurological:     Mental Status: She is alert and oriented to person, place, and time. Mental status is at baseline.  Psychiatric:        Mood and Affect: Mood normal.        Behavior: Behavior normal.      UC Treatments / Results  Labs (all labs ordered are listed, but only abnormal results are displayed) Labs Reviewed - No data to display  EKG   Radiology No results found.  Procedures Procedures (including critical care time)  Medications Ordered in UC Medications - No data to display  Initial Impression / Assessment and Plan / UC Course  I have reviewed the triage vital signs and the nursing notes.  Pertinent labs & imaging results that were available during my care of the patient were reviewed by me and considered in my medical decision making (see chart for details).  COPD exacerbation  Vital signs are stable and patient is in no signs of distress, O2  saturation 90, wheezing heard to auscultation, discussed with patient, DuoNeb and methylprednisolone injection given in office and on reevaluation partial wheezing throughout the lobes have begun to subside, prescribed prednisone 40 mg burst, Z-Pak, Tessalon and  Promethazine DM for outpatient management, given strict precautions to follow-up with urgent care or primary doctor if symptoms persist, given short precautions for worsening symptoms to go to the nearest emergency department for evaluation for intravenous medications Final Clinical Impressions(s) / UC Diagnoses   Final diagnoses:  None   Discharge Instructions   None    ED Prescriptions   None    PDMP not reviewed this encounter.   Valinda Hoar, NP 06/02/22 1133

## 2022-06-02 NOTE — ED Triage Notes (Signed)
Pt c/o wheezing, coughing x3days.  Pt is not worried about covid or flu.   Pt has an inhaler but states that when walking from room to room she gets short of breath and has trouble breathing.

## 2023-03-27 ENCOUNTER — Other Ambulatory Visit: Payer: Self-pay | Admitting: Family Medicine

## 2023-03-27 DIAGNOSIS — Z780 Asymptomatic menopausal state: Secondary | ICD-10-CM

## 2023-03-27 DIAGNOSIS — Z1231 Encounter for screening mammogram for malignant neoplasm of breast: Secondary | ICD-10-CM

## 2023-03-27 DIAGNOSIS — M85851 Other specified disorders of bone density and structure, right thigh: Secondary | ICD-10-CM

## 2023-04-02 ENCOUNTER — Ambulatory Visit
Admission: EM | Admit: 2023-04-02 | Discharge: 2023-04-02 | Disposition: A | Discharge status: Home / Self Care | Payer: Medicare (Managed Care)

## 2023-04-02 DIAGNOSIS — J441 Chronic obstructive pulmonary disease with (acute) exacerbation: Secondary | ICD-10-CM | POA: Diagnosis not present

## 2023-04-02 DIAGNOSIS — R062 Wheezing: Secondary | ICD-10-CM

## 2023-04-02 DIAGNOSIS — R051 Acute cough: Secondary | ICD-10-CM

## 2023-04-02 MED ORDER — PREDNISONE 20 MG PO TABS: 40.0000 mg | ORAL_TABLET | Freq: Every day | ORAL | 0 refills | Status: AC

## 2023-04-02 MED ORDER — BENZONATATE 200 MG PO CAPS: 200.0000 mg | ORAL_CAPSULE | Freq: Three times a day (TID) | ORAL | 0 refills | Status: DC | PRN

## 2023-04-02 MED ORDER — AZITHROMYCIN 250 MG PO TABS: 250.0000 mg | ORAL_TABLET | Freq: Every day | ORAL | 0 refills | Status: DC

## 2023-04-02 MED ORDER — PROMETHAZINE-DM 6.25-15 MG/5ML PO SYRP: 5.0000 mL | ORAL_SOLUTION | Freq: Every evening | ORAL | 0 refills | Status: DC | PRN

## 2023-04-02 NOTE — ED Provider Notes (Signed)
[facility]    CSN: [id number] Arrival date & time: 04/02/23  1116      History   Chief Complaint Chief Complaint  Patient presents with   Cough   Wheezing    HPI Rachael Mills is a 77 y.o. female with history of COPD and asthma.  She presents today for 1 week history of mostly dry cough, congestion, wheezing, shortness of breath.  States she normally is able to walk without any problems but since she has become sick she states she becomes short of breath with walking.  She has used her nebulizer here and there.  Reports using Flovent, Arnuity Ellipta, Flonase, Advair and albuterol.  Denies any sick contacts or known exposure to COVID.  No fevers, chest pain.  Taking OTC Robitussin.  States cough is worse at night.  Denies any other complaints or concerns.  HPI  Past Medical History:  Diagnosis Date   Arthritis    Asthma    COPD (chronic obstructive pulmonary disease) (HCC)    GERD (gastroesophageal reflux disease)    High cholesterol     There are no problems to display for this patient.   Past Surgical History:  Procedure Laterality Date   ABDOMINAL HYSTERECTOMY     BREAST EXCISIONAL BIOPSY Right    COLONOSCOPY WITH PROPOFOL N/A 09/19/2021   Procedure: COLONOSCOPY WITH PROPOFOL;  Surgeon: Regis Bill, MD;  Location: [facility] ENDOSCOPY;  Service: Endoscopy;  Laterality: N/A;   EYE SURGERY Bilateral 2018    OB History   No obstetric history on file.      Home Medications    Prior to Admission medications   Medication Sig Start Date End Date Taking? Authorizing Provider  albuterol (PROVENTIL HFA;VENTOLIN HFA) 108 (90 BASE) MCG/ACT inhaler Inhale into the lungs every 6 (six) hours as needed for wheezing or shortness of breath.   Yes [provider]  ARNUITY ELLIPTA 100 MCG/ACT AEPB Inhale into the lungs. 01/08/23 04/08/23 Yes [provider]  azithromycin (ZITHROMAX) 250 MG tablet Take 1 tablet (250 mg total) by mouth  daily. Take first 2 tablets together, then 1 every day until finished. 04/02/23  Yes Shirlee Latch, PA-C  benzonatate (TESSALON) 200 MG capsule Take 1 capsule (200 mg total) by mouth 3 (three) times daily as needed for cough. 04/02/23  Yes Shirlee Latch, PA-C  Cholecalciferol 50 MCG (2000 UT) CAPS Take by mouth.   Yes [provider]  FLOVENT HFA 110 MCG/ACT inhaler 1 puff 2 (two) times daily. 07/31/21  Yes [provider]  fluticasone (FLONASE) 50 MCG/ACT nasal spray Place into the nose. 02/05/23  Yes [provider]  fluticasone-salmeterol (ADVAIR HFA) 45-21 MCG/ACT inhaler Inhale 2 puffs into the lungs 2 (two) times daily.   Yes [provider]  lovastatin (MEVACOR) 20 MG tablet Take 40 mg by mouth at bedtime.   Yes [provider]  metaxalone (SKELAXIN) 800 MG tablet Take 1 tablet (800 mg total) by mouth 3 (three) times daily. 05/09/15  Yes Betancourt, Jarold Song, NP  Multiple Vitamin (MULTIVITAMIN) tablet Take 1 tablet by mouth daily.   Yes [provider]  naproxen sodium (ANAPROX) 550 MG tablet Take 1 tablet (550 mg total) by mouth 2 (two) times daily with a meal. 05/09/15  Yes Betancourt, Jarold Song, NP  prednisoLONE acetate (PRED FORTE) 1 % ophthalmic suspension  10/27/21  Yes [provider]  predniSONE (DELTASONE) 20 MG tablet Take 2 tablets (40 mg total) by mouth daily  for 5 days. 04/02/23 04/07/23 Yes Shirlee Latch, PA-C  promethazine-dextromethorphan (PROMETHAZINE-DM) 6.25-15 MG/5ML syrup Take 5 mLs by mouth at bedtime as needed for cough. 04/02/23  Yes Eusebio Friendly B, PA-C  sertraline (ZOLOFT) 50 MG tablet Take 50 mg by mouth daily.   Yes [provider]  traZODone (DESYREL) 150 MG tablet Take by mouth at bedtime.   Yes [provider]    Family History Family History  Problem Relation Age of Onset   Deep vein thrombosis Mother    Cancer Father    Breast cancer Neg Hx     Social History Social History    Tobacco Use   Smoking status: Former   Smokeless tobacco: Never  Building services engineer Use: Never used  Substance Use Topics   Alcohol use: Yes   Drug use: No     Allergies   Codeine   Review of Systems Review of Systems  Constitutional:  Positive for fatigue. Negative for chills, diaphoresis and fever.  HENT:  Positive for congestion. Negative for ear pain, rhinorrhea, sinus pressure, sinus pain and sore throat.   Respiratory:  Positive for cough, shortness of breath and wheezing.   Cardiovascular:  Negative for chest pain.  Gastrointestinal:  Negative for abdominal pain, nausea and vomiting.  Musculoskeletal:  Negative for arthralgias and myalgias.  Skin:  Negative for rash.  Neurological:  Negative for weakness and headaches.  Hematological:  Negative for adenopathy.     Physical Exam Triage Vital Signs ED Triage Vitals  Enc Vitals Group     BP 04/02/23 1127 (!) 145/74     Pulse Rate 04/02/23 1127 80     Resp 04/02/23 1127 16     Temp 04/02/23 1127 98.2 F (36.8 C)     Temp Source 04/02/23 1127 Oral     SpO2 04/02/23 1127 96 %     Weight 04/02/23 1125 148 lb (67.1 kg)     Height 04/02/23 1125 5' 4.5" (1.638 m)     Head Circumference --      Peak Flow --      Pain Score 04/02/23 1135 0     Pain Loc --      Pain Edu? --      Excl. in GC? --    No data found.  Updated Vital Signs BP (!) 145/74 (BP Location: Left Arm)   Pulse 80   Temp 98.2 F (36.8 C) (Oral)   Resp 16   Ht 5' 4.5" (1.638 m)   Wt 148 lb (67.1 kg)   SpO2 96%   BMI 25.01 kg/m   Physical Exam Vitals and nursing note reviewed.  Constitutional:      General: She is not in acute distress.    Appearance: Normal appearance. She is not ill-appearing or toxic-appearing.  HENT:     Head: Normocephalic and atraumatic.     Nose: Nose normal.     Mouth/Throat:     Mouth: Mucous membranes are moist.     Pharynx: Oropharynx is clear.  Eyes:     General: No scleral icterus.       Right eye:  No discharge.        Left eye: No discharge.     Conjunctiva/sclera: Conjunctivae normal.  Cardiovascular:     Rate and Rhythm: Normal rate and regular rhythm.     Heart sounds: Normal heart sounds.  Pulmonary:     Effort: Pulmonary effort is normal. No respiratory distress.  Breath sounds: Wheezing present.  Musculoskeletal:     Cervical back: Neck supple.  Skin:    General: Skin is dry.  Neurological:     General: No focal deficit present.     Mental Status: She is alert. Mental status is at baseline.     Motor: No weakness.     Gait: Gait normal.  Psychiatric:        Mood and Affect: Mood normal.        Behavior: Behavior normal.        Thought Content: Thought content normal.      UC Treatments / Results  Labs (all labs ordered are listed, but only abnormal results are displayed) Labs Reviewed - No data to display  EKG   Radiology No results found.  Procedures Procedures (including critical care time)  Medications Ordered in UC Medications - No data to display  Initial Impression / Assessment and Plan / UC Course  I have reviewed the triage vital signs and the nursing notes.  Pertinent labs & imaging results that were available during my care of the patient were reviewed by me and considered in my medical decision making (see chart for details).   77 year old female with history of COPD and asthma presents for 1 week history of cough, congestion, wheezing or shortness of breath.  Has been using inhalers at home as directed.  Has also been taking Robitussin.  Denies any fevers or weakness.  No sick contacts.  She is afebrile.  No acute distress.  Oxygen 96%.  On exam she has diffuse wheezing throughout all lung fields.  Offered DuoNeb treatment patient declined stating she will use her nebulizer at home if needed.  COPD exacerbation.  Sent azithromycin, prednisone, benzonatate and Promethazine DM for use at bedtime as needed for severe cough.  Encouraged  increasing rest and fluids.  Reviewed returning for fever, worsening shortness of breath or weakness.   Final Clinical Impressions(s) / UC Diagnoses   Final diagnoses:  COPD exacerbation (HCC)  Acute cough  Wheezing     Discharge Instructions      -Your COPD is flared up.  I sent corticosteroids, antibiotics and cough medicine to the pharmacy. - Increase your rest and fluids. - Use your inhalers and nebulizers at home. - Please return if fever, worsening shortness of breath or weakness.    ED Prescriptions     Medication Sig Dispense Auth. Provider   azithromycin (ZITHROMAX) 250 MG tablet Take 1 tablet (250 mg total) by mouth daily. Take first 2 tablets together, then 1 every day until finished. 6 tablet Eusebio Friendly B, PA-C   predniSONE (DELTASONE) 20 MG tablet Take 2 tablets (40 mg total) by mouth daily for 5 days. 10 tablet Eusebio Friendly B, PA-C   benzonatate (TESSALON) 200 MG capsule Take 1 capsule (200 mg total) by mouth 3 (three) times daily as needed for cough. 30 capsule Shirlee Latch, PA-C   promethazine-dextromethorphan (PROMETHAZINE-DM) 6.25-15 MG/5ML syrup Take 5 mLs by mouth at bedtime as needed for cough. 118 mL Shirlee Latch, PA-C      PDMP not reviewed this encounter.   Shirlee Latch, PA-C 04/02/23 1212

## 2023-04-02 NOTE — Discharge Instructions (Addendum)
-  Your COPD is flared up.  I sent corticosteroids, antibiotics and cough medicine to the pharmacy. - Increase your rest and fluids. - Use your inhalers and nebulizers at home. - Please return if fever, worsening shortness of breath or weakness.

## 2023-04-02 NOTE — ED Triage Notes (Signed)
Pt c/o cough & wheezing x7 days. Hx of COPD. Has tried [employer] & robitussin DM w/o relief.

## 2023-05-04 ENCOUNTER — Ambulatory Visit (INDEPENDENT_AMBULATORY_CARE_PROVIDER_SITE_OTHER): Payer: Medicare (Managed Care)

## 2023-05-04 ENCOUNTER — Encounter: Payer: Self-pay | Admitting: Emergency Medicine

## 2023-05-04 ENCOUNTER — Ambulatory Visit
Admission: EM | Admit: 2023-05-04 | Discharge: 2023-05-04 | Disposition: A | Discharge status: Home / Self Care | Payer: Medicare (Managed Care) | Attending: Physician Assistant | Admitting: Physician Assistant

## 2023-05-04 DIAGNOSIS — J449 Chronic obstructive pulmonary disease, unspecified: Secondary | ICD-10-CM | POA: Diagnosis not present

## 2023-05-04 DIAGNOSIS — U071 COVID-19: Secondary | ICD-10-CM | POA: Insufficient documentation

## 2023-05-04 DIAGNOSIS — J029 Acute pharyngitis, unspecified: Secondary | ICD-10-CM

## 2023-05-04 DIAGNOSIS — R059 Cough, unspecified: Secondary | ICD-10-CM

## 2023-05-04 DIAGNOSIS — R051 Acute cough: Secondary | ICD-10-CM

## 2023-05-04 LAB — GROUP A STREP BY PCR: Group A Strep by PCR: NOT DETECTED

## 2023-05-04 LAB — SARS CORONAVIRUS 2 BY RT PCR: SARS Coronavirus 2 by RT PCR: POSITIVE — AB

## 2023-05-04 MED ORDER — PROMETHAZINE-DM 6.25-15 MG/5ML PO SYRP: 5.0000 mL | ORAL_SOLUTION | Freq: Every evening | ORAL | 0 refills | Status: DC | PRN

## 2023-05-04 MED ORDER — NIRMATRELVIR/RITONAVIR (PAXLOVID)TABLET: 3.0000 | ORAL_TABLET | Freq: Two times a day (BID) | ORAL | 0 refills | Status: AC

## 2023-05-04 NOTE — Discharge Instructions (Addendum)
-  COVID is positive. Isolate until no fever >24 hours and feeling better.  -If Paxlovid was sent you should not take your statin cholesterol medicine for the 5 days you are taking Paxlovid and for the 5 days after.  -Take cough medicine as needed. Increase rest and fluids -Continue inhalers. -Go to ER if uncontrollable fever, significant fatigue/weakness or breathing difficulty not improved by inhalers.

## 2023-05-04 NOTE — ED Provider Notes (Signed)
[facility]    CSN: [id number] Arrival date & time: 05/04/23  0934      History   Chief Complaint Chief Complaint  Patient presents with   Cough    HPI Rachael Mills is a 77 y.o. female with history of COPD and asthma.  She presents today for 1 day history of  cough which is mostly dry, chills and sore throat. Reports blood tinged sputum once this morning. Denies SOB or wheezing.  Reports using Flovent, Arnuity Ellipta, Flonase, Advair and albuterol.  Denies any sick contacts or known exposure to COVID.  No fevers, chest pain.  Taking OTC Robitussin.  States cough is worse at night.  Denies any other complaints or concerns.  Patient was seen here by myself 1 month ago for COPD exacerbation and treated with azithromycin, prednisone, promethazine DM and benzonatate. Patient says she recovered quickly with previous treatment.   HPI  Past Medical History:  Diagnosis Date   Arthritis    Asthma    COPD (chronic obstructive pulmonary disease) (HCC)    GERD (gastroesophageal reflux disease)    High cholesterol     There are no problems to display for this patient.   Past Surgical History:  Procedure Laterality Date   ABDOMINAL HYSTERECTOMY     BREAST EXCISIONAL BIOPSY Right    COLONOSCOPY WITH PROPOFOL N/A 09/19/2021   Procedure: COLONOSCOPY WITH PROPOFOL;  Surgeon: Regis Bill, MD;  Location: [facility];  Service: Endoscopy;  Laterality: N/A;   EYE SURGERY Bilateral 2018    OB History   No obstetric history on file.      Home Medications    Prior to Admission medications   Medication Sig Start Date End Date Taking? Authorizing Provider  albuterol (PROVENTIL HFA;VENTOLIN HFA) 108 (90 BASE) MCG/ACT inhaler Inhale into the lungs every 6 (six) hours as needed for wheezing or shortness of breath.   Yes [provider]  fluticasone-salmeterol (ADVAIR HFA) 45-21 MCG/ACT inhaler Inhale 2 puffs into the lungs 2 (two) times daily.   Yes  [provider]  lovastatin (MEVACOR) 20 MG tablet Take 40 mg by mouth at bedtime.   Yes [provider]  nirmatrelvir/ritonavir (PAXLOVID) 20 x 150 MG & 10 x 100MG  TABS Take 3 tablets by mouth 2 (two) times daily for 5 days. Patient GFR is 67. Take nirmatrelvir (150 mg) two tablets twice daily for 5 days and ritonavir (100 mg) one tablet twice daily for 5 days. 05/04/23 05/09/23 Yes Eusebio Friendly B, PA-C  sertraline (ZOLOFT) 50 MG tablet Take 50 mg by mouth daily.   Yes [provider]  traZODone (DESYREL) 150 MG tablet Take by mouth at bedtime.   Yes [provider]  ARNUITY ELLIPTA 100 MCG/ACT AEPB Inhale 1 puff into the lungs daily.    [provider]  Cholecalciferol 50 MCG (2000 UT) CAPS Take by mouth.    [provider]  FLOVENT HFA 110 MCG/ACT inhaler 1 puff 2 (two) times daily. 07/31/21   [provider]  fluticasone (FLONASE) 50 MCG/ACT nasal spray Place into the nose. 02/05/23   [provider]  metaxalone (SKELAXIN) 800 MG tablet Take 1 tablet (800 mg total) by mouth 3 (three) times daily. 05/09/15   Betancourt, Jarold Song, NP  Multiple Vitamin (MULTIVITAMIN) tablet Take 1 tablet by mouth daily.    [provider]  prednisoLONE acetate (PRED FORTE) 1 % ophthalmic suspension  10/27/21   [provider]  promethazine-dextromethorphan (PROMETHAZINE-DM) 6.25-15 MG/5ML  syrup Take 5 mLs by mouth at bedtime as needed for cough. 05/04/23   Shirlee Latch, PA-C    Family History Family History  Problem Relation Age of Onset   Deep vein thrombosis Mother    Cancer Father    Breast cancer Neg Hx     Social History Social History   Tobacco Use   Smoking status: Former   Smokeless tobacco: Never  Vaping Use   Vaping status: Never Used  Substance Use Topics   Alcohol use: Yes   Drug use: No     Allergies   Codeine   Review of Systems Review of Systems  Constitutional:  Positive for chills and  fatigue. Negative for diaphoresis and fever.  HENT:  Positive for sore throat. Negative for congestion, ear pain, rhinorrhea, sinus pressure and sinus pain.   Respiratory:  Positive for cough. Negative for shortness of breath and wheezing.   Cardiovascular:  Negative for chest pain.  Gastrointestinal:  Negative for abdominal pain, nausea and vomiting.  Musculoskeletal:  Negative for arthralgias and myalgias.  Skin:  Negative for rash.  Neurological:  Negative for weakness and headaches.  Hematological:  Negative for adenopathy.     Physical Exam Triage Vital Signs ED Triage Vitals  Enc Vitals Group     BP 04/02/23 1127 (!) 145/74     Pulse Rate 04/02/23 1127 80     Resp 04/02/23 1127 16     Temp 04/02/23 1127 98.2 F (36.8 C)     Temp Source 04/02/23 1127 Oral     SpO2 04/02/23 1127 96 %     Weight 04/02/23 1125 148 lb (67.1 kg)     Height 04/02/23 1125 5' 4.5" (1.638 m)     Head Circumference --      Peak Flow --      Pain Score 04/02/23 1135 0     Pain Loc --      Pain Edu? --      Excl. in GC? --    No data found.  Updated Vital Signs BP (!) 157/78 (BP Location: Right Arm)   Pulse 92   Temp 98.6 F (37 C) (Oral)   Resp 18   Ht 5' 4.5" (1.638 m)   Wt 147 lb 14.9 oz (67.1 kg)   SpO2 93%   BMI 25.00 kg/m   Physical Exam Vitals and nursing note reviewed.  Constitutional:      General: She is not in acute distress.    Appearance: Normal appearance. She is not ill-appearing or toxic-appearing.  HENT:     Head: Normocephalic and atraumatic.     Nose: Congestion and rhinorrhea (trace clear) present.     Mouth/Throat:     Mouth: Mucous membranes are moist.     Pharynx: Oropharynx is clear. Posterior oropharyngeal erythema present.  Eyes:     General: No scleral icterus.       Right eye: No discharge.        Left eye: No discharge.     Conjunctiva/sclera: Conjunctivae normal.  Cardiovascular:     Rate and Rhythm: Normal rate and regular rhythm.     Heart  sounds: Normal heart sounds.  Pulmonary:     Effort: Pulmonary effort is normal. No respiratory distress.     Breath sounds: No wheezing or rhonchi.  Musculoskeletal:     Cervical back: Neck supple.  Skin:    General: Skin is dry.  Neurological:     General: No focal deficit  present.     Mental Status: She is alert. Mental status is at baseline.     Motor: No weakness.     Gait: Gait normal.  Psychiatric:        Mood and Affect: Mood normal.        Behavior: Behavior normal.        Thought Content: Thought content normal.      UC Treatments / Results  Labs (all labs ordered are listed, but only abnormal results are displayed) Labs Reviewed  SARS CORONAVIRUS 2 BY RT PCR - Abnormal; Notable for the following components:      Result Value   SARS Coronavirus 2 by RT PCR POSITIVE (*)    All other components within normal limits  GROUP A STREP BY PCR    EKG   Radiology DG Chest 2 View  Result Date: 05/04/2023 CLINICAL DATA:  Cough, hemoptysis. EXAM: CHEST - 2 VIEW COMPARISON:  None Available. FINDINGS: The heart size and mediastinal contours are within normal limits. Both lungs are clear. The visualized skeletal structures are unremarkable. IMPRESSION: No active cardiopulmonary disease. Electronically Signed   By: Lupita Raider M.D.   On: 05/04/2023 11:00    Procedures Procedures (including critical care time)  Medications Ordered in UC Medications - No data to display  Initial Impression / Assessment and Plan / UC Course  I have reviewed the triage vital signs and the nursing notes.  Pertinent labs & imaging results that were available during my care of the patient were reviewed by me and considered in my medical decision making (see chart for details).   77 year old female with history of COPD and asthma presents for 1 day history of cough, sore throat and chills. Has been using inhalers at home as directed.  Has been taking Promethazine DM.  Denies any fevers or  weakness.  No SOB or wheezing. No sick contacts.  She is afebrile.  No acute distress.  Oxygen 93%.  On exam she has mild erythema of posterior pharynx and slight congestion with clear drainage. Chest is clear throughout all lung fields.  Strep and COVID testing obtained. COVID + CXR obtained. Negative x-ray.  COVID 19. Last renal function 3 months ago shows GFR 67. Prescribed Paxlovid and Promethazine DM for use at bedtime as needed for severe cough.  Reviewed current CDC guidelines, isolation  protocol and ED precautions. Encouraged increasing rest and fluids.  Reviewed returning for fever, worsening shortness of breath or weakness.   Final Clinical Impressions(s) / UC Diagnoses   Final diagnoses:  COVID-19  Acute cough  Sore throat  Chronic obstructive pulmonary disease, unspecified COPD type (HCC)     Discharge Instructions      -COVID is positive. Isolate until no fever >24 hours and feeling better.  -If Paxlovid was sent you should not take your statin cholesterol medicine for the 5 days you are taking Paxlovid and for the 5 days after.  -Take cough medicine as needed. Increase rest and fluids -Continue inhalers. -Go to ER if uncontrollable fever, significant fatigue/weakness or breathing difficulty not improved by inhalers.     ED Prescriptions     Medication Sig Dispense Auth. Provider   promethazine-dextromethorphan (PROMETHAZINE-DM) 6.25-15 MG/5ML syrup Take 5 mLs by mouth at bedtime as needed for cough. 118 mL Shirlee Latch, PA-C   nirmatrelvir/ritonavir (PAXLOVID) 20 x 150 MG & 10 x 100MG  TABS Take 3 tablets by mouth 2 (two) times daily for 5 days. Patient GFR is 67.  Take nirmatrelvir (150 mg) two tablets twice daily for 5 days and ritonavir (100 mg) one tablet twice daily for 5 days. 30 tablet Gareth Morgan      PDMP not reviewed this encounter.     Shirlee Latch, PA-C 05/04/23 1121

## 2023-05-04 NOTE — ED Triage Notes (Signed)
Pt c/o cough, sore throat, chills. Started yesterday. Unsure if she has had fever. She states she coughed up a little blood this morning.

## 2023-05-17 ENCOUNTER — Ambulatory Visit: Payer: Medicare (Managed Care)

## 2023-05-17 ENCOUNTER — Other Ambulatory Visit: Payer: Medicare (Managed Care)

## 2023-06-13 ENCOUNTER — Ambulatory Visit
Admission: RE | Admit: 2023-06-13 | Discharge: 2023-06-13 | Disposition: A | Discharge status: Home / Self Care | Payer: Medicare (Managed Care) | Source: Ambulatory Visit | Attending: Family Medicine | Admitting: Family Medicine

## 2023-06-13 DIAGNOSIS — Z780 Asymptomatic menopausal state: Secondary | ICD-10-CM

## 2023-06-13 DIAGNOSIS — M85851 Other specified disorders of bone density and structure, right thigh: Secondary | ICD-10-CM | POA: Diagnosis present

## 2023-06-13 DIAGNOSIS — Z1231 Encounter for screening mammogram for malignant neoplasm of breast: Secondary | ICD-10-CM | POA: Insufficient documentation

## 2024-05-26 ENCOUNTER — Ambulatory Visit
Admission: EM | Admit: 2024-05-26 | Discharge: 2024-05-26 | Disposition: A | Discharge status: Home / Self Care | Attending: Physician Assistant | Admitting: Physician Assistant

## 2024-05-26 DIAGNOSIS — J449 Chronic obstructive pulmonary disease, unspecified: Secondary | ICD-10-CM | POA: Diagnosis present

## 2024-05-26 DIAGNOSIS — J029 Acute pharyngitis, unspecified: Secondary | ICD-10-CM | POA: Diagnosis not present

## 2024-05-26 DIAGNOSIS — U071 COVID-19: Secondary | ICD-10-CM | POA: Diagnosis not present

## 2024-05-26 DIAGNOSIS — R051 Acute cough: Secondary | ICD-10-CM | POA: Insufficient documentation

## 2024-05-26 LAB — BASIC METABOLIC PANEL WITH GFR
Anion gap: 11 (ref 5–15)
BUN: 15 mg/dL (ref 8–23)
CO2: 27 mmol/L (ref 22–32)
Calcium: 9 mg/dL (ref 8.9–10.3)
Chloride: 99 mmol/L (ref 98–111)
Creatinine, Ser: 0.85 mg/dL (ref 0.44–1.00)
GFR, Estimated: >60 mL/min (ref >60)
Glucose, Bld: 94 mg/dL (ref 70–99)
Potassium: 3.9 mmol/L (ref 3.5–5.1)
Sodium: 137 mmol/L (ref 135–145)

## 2024-05-26 LAB — SARS CORONAVIRUS 2 BY RT PCR: SARS Coronavirus 2 by RT PCR: POSITIVE — AB

## 2024-05-26 MED ORDER — PREDNISONE 20 MG PO TABS: 40.0000 mg | ORAL_TABLET | Freq: Every day | ORAL | 0 refills | Status: AC

## 2024-05-26 MED ORDER — PAXLOVID (300/100) 20 X 150 MG & 10 X 100MG PO TBPK: 3.0000 | ORAL_TABLET | Freq: Two times a day (BID) | ORAL | 0 refills | Status: AC

## 2024-05-26 MED ORDER — PROMETHAZINE-DM 6.25-15 MG/5ML PO SYRP: 5.0000 mL | ORAL_SOLUTION | Freq: Every evening | ORAL | 0 refills | Status: AC | PRN

## 2024-05-26 NOTE — ED Provider Notes (Signed)
 [facility]    CSN: [id number] Arrival date & time: 05/26/24  9187      History   Chief Complaint Chief Complaint  Patient presents with   Covid Positive   Fatigue    HPI Rachael Mills is a 78 y.o. female with history of COPD and asthma.  She presents today for 1 day history of cough which is mostly dry, headache, body aches, runny nose, fatigue, and sore throat. Mild increased SOB/wheezing from baseline. Used neb and it helped.  Reports using Flovent, Arnuity Ellipta, Flonase, and albuterol .  Denies any sick contacts or known exposure to COVID.  No fevers, chest pain. Taking OTC Robitussin.  States cough is worse at night. Positive COVID test at home this morning. History of COVID last year and took Paxlovid , which she tolerated well.  Denies any other complaints or concerns.   HPI  Past Medical History:  Diagnosis Date   Arthritis    Asthma    COPD (chronic obstructive pulmonary disease) (HCC)    GERD (gastroesophageal reflux disease)    High cholesterol     There are no active problems to display for this patient.   Past Surgical History:  Procedure Laterality Date   ABDOMINAL HYSTERECTOMY     BREAST EXCISIONAL BIOPSY Right    COLONOSCOPY WITH PROPOFOL  N/A 09/19/2021   Procedure: COLONOSCOPY WITH PROPOFOL ;  Surgeon: Maryruth Ole DASEN, MD;  Location: [facility];  Service: Endoscopy;  Laterality: N/A;   EYE SURGERY Bilateral 2018    OB History   No obstetric history on file.      Home Medications    Prior to Admission medications   Medication Sig Start Date End Date Taking? Authorizing Provider  nirmatrelvir /ritonavir  (PAXLOVID , 300/100,) 20 x 150 MG & 10 x 100MG  TBPK Take 3 tablets by mouth 2 (two) times daily for 5 days. Patient GFR is over 60 Take nirmatrelvir  (150 mg) two tablets twice daily for 5 days and ritonavir  (100 mg) one tablet twice daily for 5 days. 05/26/24 05/31/24 Yes Arvis Huxley B, PA-C  predniSONE  (DELTASONE ) 20 MG  tablet Take 2 tablets (40 mg total) by mouth daily for 5 days. 05/26/24 05/31/24 Yes Arvis Huxley NOVAK, PA-C  promethazine -dextromethorphan (PROMETHAZINE -DM) 6.25-15 MG/5ML syrup Take 5 mLs by mouth at bedtime as needed for cough. 05/26/24  Yes Arvis Huxley B, PA-C  albuterol  (PROVENTIL  HFA;VENTOLIN  HFA) 108 (90 BASE) MCG/ACT inhaler Inhale into the lungs every 6 (six) hours as needed for wheezing or shortness of breath.    [provider]  ARNUITY ELLIPTA 100 MCG/ACT AEPB Inhale 1 puff into the lungs daily.    [provider]  Cholecalciferol 50 MCG (2000 UT) CAPS Take by mouth.    [provider]  FLOVENT HFA 110 MCG/ACT inhaler 1 puff 2 (two) times daily. 07/31/21   [provider]  fluticasone (FLONASE) 50 MCG/ACT nasal spray Place into the nose. 02/05/23   [provider]  lovastatin (MEVACOR) 20 MG tablet Take 40 mg by mouth at bedtime.    [provider]  Multiple Vitamin (MULTIVITAMIN) tablet Take 1 tablet by mouth daily.    [provider]  rosuvastatin (CRESTOR) 20 MG tablet Take 20 mg by mouth at bedtime.    [provider]  sertraline (ZOLOFT) 50 MG tablet Take 50 mg by mouth daily.    [provider]  traZODone (DESYREL) 150 MG tablet Take by mouth at bedtime.    [provider]    [facility]  History Family History  Problem Relation Age of Onset   Deep vein thrombosis Mother    Cancer Father    Breast cancer Neg Hx     Social History Social History   Tobacco Use   Smoking status: Former   Smokeless tobacco: Never  Advertising account planner   Vaping status: Never Used  Substance Use Topics   Alcohol use: Yes   Drug use: No     Allergies   Codeine   Review of Systems Review of Systems  Constitutional:  Positive for chills and fatigue. Negative for diaphoresis and fever.  HENT:  Positive for congestion, rhinorrhea and sore throat. Negative for ear pain, sinus pressure and sinus pain.   Respiratory:   Positive for cough, shortness of breath and wheezing.   Cardiovascular:  Negative for chest pain.  Gastrointestinal:  Negative for abdominal pain, nausea and vomiting.  Musculoskeletal:  Positive for myalgias.  Skin:  Negative for rash.  Neurological:  Positive for headaches. Negative for weakness.  Hematological:  Negative for adenopathy.     Physical Exam Triage Vital Signs  No data found.  Updated Vital Signs BP (!) 155/92 (BP Location: Left Arm)   Pulse 88   Temp 98.4 F (36.9 C) (Oral)   Resp 18   Ht 5' 4 (1.626 m)   Wt 145 lb 9.6 oz (66 kg)   SpO2 94%   BMI 24.99 kg/m   Physical Exam Vitals and nursing note reviewed.  Constitutional:      General: She is not in acute distress.    Appearance: Normal appearance. She is not ill-appearing or toxic-appearing.  HENT:     Head: Normocephalic and atraumatic.     Nose: Congestion and rhinorrhea (trace clear) present.     Mouth/Throat:     Mouth: Mucous membranes are moist.     Pharynx: Oropharynx is clear. Posterior oropharyngeal erythema present.  Eyes:     General: No scleral icterus.       Right eye: No discharge.        Left eye: No discharge.     Conjunctiva/sclera: Conjunctivae normal.  Cardiovascular:     Rate and Rhythm: Normal rate and regular rhythm.     Heart sounds: Normal heart sounds.  Pulmonary:     Effort: Pulmonary effort is normal. No respiratory distress.     Breath sounds: Wheezing (very mild wheezing noted upper airways) present. No rhonchi.  Musculoskeletal:     Cervical back: Neck supple.  Skin:    General: Skin is dry.  Neurological:     General: No focal deficit present.     Mental Status: She is alert. Mental status is at baseline.     Motor: No weakness.     Gait: Gait normal.  Psychiatric:        Mood and Affect: Mood normal.        Behavior: Behavior normal.      UC Treatments / Results  Labs (all labs ordered are listed, but only abnormal results are displayed) Labs  Reviewed  SARS CORONAVIRUS 2 BY RT PCR - Abnormal; Notable for the following components:      Result Value   SARS Coronavirus 2 by RT PCR POSITIVE (*)    All other components within normal limits  BASIC METABOLIC PANEL WITH GFR    EKG   Radiology No results found.  Procedures Procedures (including critical care time)  Medications Ordered in UC Medications - No data to display  Initial Impression /  Assessment and Plan / UC Course  I have reviewed the triage vital signs and the nursing notes.  Pertinent labs & imaging results that were available during my care of the patient were reviewed by me and considered in my medical decision making (see chart for details).   78 year old female with history of COPD and asthma presents for 1 day history of cough, runny nose, headaches, body aches, and sore throat. Mild shortness of breath/wheezing from baseline. Has been using inhalers at home as directed.  Has been taking Robitussin.  Denies any fevers, chest pain, or weakness.   She is afebrile.  No acute distress.  Oxygen  94%.  On exam she has mild erythema of posterior pharynx and slight congestion with clear drainage. Mild wheezing bilateral upper lung fields.  COVID testing obtained. COVID +  COVID 19. Last renal function over 1 year ago. BMP drawn today.  GFR >60. Prescribed Paxlovid  and Promethazine  DM for use at bedtime as needed for severe cough.  Reviewed current CDC guidelines, isolation  protocol and ED precautions. Encouraged increasing rest and fluids. Printed prescription for prednisone  in case COPD/asthma flares up. Reviewed returning for fever, worsening shortness of breath or weakness. Work note given.   Final Clinical Impressions(s) / UC Diagnoses   Final diagnoses:  COVID-19  Acute cough  Sore throat  Chronic obstructive pulmonary disease, unspecified COPD type (HCC)     Discharge Instructions      -COVID is positive. Isolate until no fever >24 hours and feeling  better.  -If Paxlovid  was sent you should not take your statin cholesterol medicine for the 5 days you are taking Paxlovid  and for the 5 days after.  -Take cough medicine as needed. Increase rest and fluids -Continue inhalers. -Begin prednisone  if increased shortness of breath/wheezing. -Go to ER if uncontrollable fever, significant fatigue/weakness or breathing difficulty not improved by inhalers.      ED Prescriptions     Medication Sig Dispense Auth. Provider   promethazine -dextromethorphan (PROMETHAZINE -DM) 6.25-15 MG/5ML syrup Take 5 mLs by mouth at bedtime as needed for cough. 118 mL Arvis Huxley B, PA-C   nirmatrelvir /ritonavir  (PAXLOVID , 300/100,) 20 x 150 MG & 10 x 100MG  TBPK Take 3 tablets by mouth 2 (two) times daily for 5 days. Patient GFR is over 60 Take nirmatrelvir  (150 mg) two tablets twice daily for 5 days and ritonavir  (100 mg) one tablet twice daily for 5 days. 30 tablet Arvis Huxley B, PA-C   predniSONE  (DELTASONE ) 20 MG tablet Take 2 tablets (40 mg total) by mouth daily for 5 days. 10 tablet Keane Martelli B, PA-C      PDMP not reviewed this encounter.       Arvis Huxley NOVAK, PA-C 05/26/24 [phone]

## 2024-05-26 NOTE — Discharge Instructions (Addendum)
-  COVID is positive. Isolate until no fever >24 hours and feeling better.  -If Paxlovid  was sent you should not take your statin cholesterol medicine for the 5 days you are taking Paxlovid  and for the 5 days after.  -Take cough medicine as needed. Increase rest and fluids -Continue inhalers. -Begin prednisone  if increased shortness of breath/wheezing. -Go to ER if uncontrollable fever, significant fatigue/weakness or breathing difficulty not improved by inhalers.

## 2024-05-26 NOTE — ED Triage Notes (Signed)
 Pt c/o sore throat,cough,HA & fatigue x1 day. States tested + for covid at home yesterday. Has tried tylenol w/o relief.

## 2024-09-08 ENCOUNTER — Other Ambulatory Visit: Payer: Self-pay | Admitting: Family Medicine

## 2024-09-08 DIAGNOSIS — Z1231 Encounter for screening mammogram for malignant neoplasm of breast: Secondary | ICD-10-CM

## 2024-10-20 ENCOUNTER — Ambulatory Visit
Admission: RE | Admit: 2024-10-20 | Discharge: 2024-10-20 | Disposition: A | Discharge status: Home / Self Care | Payer: Medicare (Managed Care) | Source: Ambulatory Visit | Attending: Family Medicine | Admitting: Family Medicine

## 2024-10-20 DIAGNOSIS — Z1231 Encounter for screening mammogram for malignant neoplasm of breast: Secondary | ICD-10-CM | POA: Insufficient documentation

## 2024-10-24 ENCOUNTER — Other Ambulatory Visit: Payer: Self-pay | Admitting: Medical Genetics

## 2024-10-27 ENCOUNTER — Other Ambulatory Visit
Admission: RE | Admit: 2024-10-27 | Discharge: 2024-10-27 | Disposition: A | Discharge status: Home / Self Care | Payer: Self-pay | Source: Ambulatory Visit | Attending: Medical Genetics | Admitting: Medical Genetics

## 2024-11-01 ENCOUNTER — Encounter: Payer: Self-pay | Admitting: Emergency Medicine

## 2024-11-01 ENCOUNTER — Ambulatory Visit
Admission: EM | Admit: 2024-11-01 | Discharge: 2024-11-01 | Disposition: A | Discharge status: Home / Self Care | Payer: Medicare (Managed Care) | Attending: Family Medicine | Admitting: Family Medicine

## 2024-11-01 DIAGNOSIS — A46 Erysipelas: Secondary | ICD-10-CM | POA: Diagnosis not present

## 2024-11-01 MED ORDER — AMOXICILLIN-POT CLAVULANATE 875-125 MG PO TABS: 1.0000 | ORAL_TABLET | Freq: Two times a day (BID) | ORAL | 0 refills | Status: AC

## 2024-11-01 NOTE — Discharge Instructions (Signed)
 Antibiotic as prescribed.  Please follow-up with your primary care physician.  If you worsen, please go to the hospital.

## 2024-11-01 NOTE — ED Triage Notes (Signed)
 Patient c/o left sided facial pain and swelling that started 2 days ago.  Patient denies fevers.  Patient denies any other cold symptoms.

## 2024-11-01 NOTE — ED Provider Notes (Signed)
 " [facility]    CSN: [id number] Arrival date & time: 11/01/24  0804   History   Chief Complaint Chief Complaint  Patient presents with   Sinus Problem   Facial Pain    HPI 79 year old female presents for evaluation of the above.  Patient reports that on Monday she had some sinus pressure on the left side.  She has been doing okay and has not really had any other respiratory symptoms.  Yesterday she developed swelling and redness to the left side of the face starting at the maxillary region and extending distally.  No documented fever.  No relieving factors.  Past Medical History:  Diagnosis Date   Arthritis    Asthma    COPD (chronic obstructive pulmonary disease) (HCC)    GERD (gastroesophageal reflux disease)    High cholesterol    Past Surgical History:  Procedure Laterality Date   ABDOMINAL HYSTERECTOMY     BREAST EXCISIONAL BIOPSY Right    COLONOSCOPY WITH PROPOFOL  N/A 09/19/2021   Procedure: COLONOSCOPY WITH PROPOFOL ;  Surgeon: Maryruth Ole DASEN, MD;  Location: [facility];  Service: Endoscopy;  Laterality: N/A;   EYE SURGERY Bilateral 2018    OB History   No obstetric history on file.      Home Medications    Prior to Admission medications  Medication Sig Start Date End Date Taking? Authorizing Provider  amoxicillin -clavulanate (AUGMENTIN ) 875-125 MG tablet Take 1 tablet by mouth every 12 (twelve) hours. 11/01/24  Yes Ramon Zanders G, DO  albuterol  (PROVENTIL  HFA;VENTOLIN  HFA) 108 (90 BASE) MCG/ACT inhaler Inhale into the lungs every 6 (six) hours as needed for wheezing or shortness of breath.    [provider]  ARNUITY ELLIPTA 100 MCG/ACT AEPB Inhale 1 puff into the lungs daily.    [provider]  Cholecalciferol 50 MCG (2000 UT) CAPS Take by mouth.    [provider]  FLOVENT HFA 110 MCG/ACT inhaler 1 puff 2 (two) times daily. 07/31/21   [provider]  fluticasone (FLONASE) 50 MCG/ACT nasal spray Place  into the nose. 02/05/23   [provider]  lovastatin (MEVACOR) 20 MG tablet Take 40 mg by mouth at bedtime.    [provider]  Multiple Vitamin (MULTIVITAMIN) tablet Take 1 tablet by mouth daily.    [provider]  promethazine -dextromethorphan (PROMETHAZINE -DM) 6.25-15 MG/5ML syrup Take 5 mLs by mouth at bedtime as needed for cough. 05/26/24   Arvis Huxley B, PA-C  rosuvastatin (CRESTOR) 20 MG tablet Take 20 mg by mouth at bedtime.    [provider]  sertraline (ZOLOFT) 50 MG tablet Take 50 mg by mouth daily.    [provider]  traZODone (DESYREL) 150 MG tablet Take by mouth at bedtime.    [provider]    Family History Family History  Problem Relation Age of Onset   Deep vein thrombosis Mother    Cancer Father    Breast cancer Neg Hx     Social History Social History[1]   Allergies   Codeine   Review of Systems Review of Systems Per HPI  Physical Exam Triage Vital Signs ED Triage Vitals  Encounter Vitals Group     BP 11/01/24 0821 (!) 161/89     Girls Systolic BP Percentile --      Girls Diastolic BP Percentile --      Boys Systolic BP Percentile --      Boys Diastolic BP Percentile --      Pulse Rate  11/01/24 0821 76     Resp 11/01/24 0821 14     Temp 11/01/24 0821 98.4 F (36.9 C)     Temp Source 11/01/24 0821 Oral     SpO2 11/01/24 0821 94 %     Weight 11/01/24 0819 145 lb 8.1 oz (66 kg)     Height 11/01/24 0819 5' 4 (1.626 m)     Head Circumference --      Peak Flow --      Pain Score 11/01/24 0819 8     Pain Loc --      Pain Education --      Exclude from Growth Chart --    No data found.  Updated Vital Signs BP (!) 161/89 (BP Location: Right Arm)   Pulse 76   Temp 98.4 F (36.9 C) (Oral)   Resp 14   Ht 5' 4 (1.626 m)   Wt 66 kg   SpO2 94%   BMI 24.98 kg/m   Visual Acuity Right Eye Distance:   Left Eye Distance:   Bilateral Distance:    Right Eye Near:   Left Eye Near:     Bilateral Near:     Physical Exam Vitals and nursing note reviewed.  Constitutional:      General: She is not in acute distress. HENT:     Head: Normocephalic and atraumatic.      Comments: Swelling, erythema, and warmth at the labeled location.    Nose: Nose normal. No rhinorrhea.     Mouth/Throat:     Pharynx: Oropharynx is clear.  Cardiovascular:     Rate and Rhythm: Normal rate and regular rhythm.  Pulmonary:     Effort: Pulmonary effort is normal.     Breath sounds: Normal breath sounds.  Neurological:     Mental Status: She is alert.      UC Treatments / Results  Labs (all labs ordered are listed, but only abnormal results are displayed) Labs Reviewed - No data to display  EKG   Radiology No results found.  Procedures Procedures (including critical care time)  Medications Ordered in UC Medications - No data to display  Initial Impression / Assessment and Plan / UC Course  I have reviewed the triage vital signs and the nursing notes.  Pertinent labs & imaging results that were available during my care of the patient were reviewed by me and considered in my medical decision making (see chart for details).    79 year old female presents with an exam concerning for erysipelas.  Augmentin  as directed.    Final Clinical Impressions(s) / UC Diagnoses   Final diagnoses:  Erysipelas     Discharge Instructions      Antibiotic as prescribed.  Please follow-up with your primary care physician.  If you worsen, please go to the hospital.   ED Prescriptions     Medication Sig Dispense Auth. Provider   amoxicillin -clavulanate (AUGMENTIN ) 875-125 MG tablet Take 1 tablet by mouth every 12 (twelve) hours. 14 tablet Hildreth Robart G, DO      PDMP not reviewed this encounter.    [1]  Social History Tobacco Use   Smoking status: Former   Smokeless tobacco: Never  Vaping Use   Vaping status: Never Used  Substance Use Topics   Alcohol use: Yes   Drug use:  No     Gary Bultman G, DO 11/01/24 9095  "

## 2024-11-04 LAB — GENECONNECT MOLECULAR SCREEN: Genetic Analysis Overall Interpretation: NEGATIVE
